# Patient Record
Sex: Female | Born: 1955 | Race: White | Hispanic: No | Marital: Married | Smoking: Never smoker
Health system: Southern US, Community
[De-identification: ages and names within clinical notes are randomized; demographics above are authoritative.]

## PROBLEM LIST (undated history)

## (undated) DIAGNOSIS — E119 Type 2 diabetes mellitus without complications: Secondary | ICD-10-CM

## (undated) DIAGNOSIS — C801 Malignant (primary) neoplasm, unspecified: Secondary | ICD-10-CM

## (undated) DIAGNOSIS — K5792 Diverticulitis of intestine, part unspecified, without perforation or abscess without bleeding: Secondary | ICD-10-CM

## (undated) DIAGNOSIS — E079 Disorder of thyroid, unspecified: Secondary | ICD-10-CM

## (undated) DIAGNOSIS — I10 Essential (primary) hypertension: Secondary | ICD-10-CM

## (undated) HISTORY — PX: ABDOMINAL HYSTERECTOMY: SHX81

---

## 2004-01-21 ENCOUNTER — Ambulatory Visit: Payer: Self-pay | Admitting: Unknown Physician Specialty

## 2004-01-26 ENCOUNTER — Ambulatory Visit: Payer: Self-pay | Admitting: Unknown Physician Specialty

## 2004-01-27 ENCOUNTER — Ambulatory Visit: Payer: Self-pay | Admitting: Unknown Physician Specialty

## 2004-06-14 ENCOUNTER — Ambulatory Visit: Payer: Self-pay | Admitting: Unknown Physician Specialty

## 2006-06-07 ENCOUNTER — Ambulatory Visit: Payer: Self-pay | Admitting: Unknown Physician Specialty

## 2006-07-21 ENCOUNTER — Emergency Department: Payer: Self-pay | Admitting: Emergency Medicine

## 2007-06-06 ENCOUNTER — Ambulatory Visit: Payer: Self-pay | Admitting: Gastroenterology

## 2008-08-26 ENCOUNTER — Ambulatory Visit: Payer: Self-pay | Admitting: Family Medicine

## 2009-09-22 ENCOUNTER — Ambulatory Visit: Payer: Self-pay | Admitting: Unknown Physician Specialty

## 2009-10-06 ENCOUNTER — Ambulatory Visit: Payer: Self-pay | Admitting: Anesthesiology

## 2009-10-11 ENCOUNTER — Ambulatory Visit: Payer: Self-pay | Admitting: Surgery

## 2010-10-26 ENCOUNTER — Ambulatory Visit: Payer: Self-pay | Admitting: Family Medicine

## 2011-10-31 ENCOUNTER — Ambulatory Visit: Payer: Self-pay | Admitting: Family Medicine

## 2011-11-02 ENCOUNTER — Ambulatory Visit: Payer: Self-pay | Admitting: Family Medicine

## 2012-10-31 ENCOUNTER — Ambulatory Visit: Payer: Self-pay | Admitting: Internal Medicine

## 2013-10-15 ENCOUNTER — Observation Stay: Payer: Self-pay | Admitting: Internal Medicine

## 2013-10-15 LAB — COMPREHENSIVE METABOLIC PANEL
ALBUMIN: 3.5 g/dL (ref 3.4–5.0)
ALK PHOS: 52 U/L
AST: 42 U/L — AB (ref 15–37)
Anion Gap: 10 (ref 7–16)
BUN: 14 mg/dL (ref 7–18)
Bilirubin,Total: 0.7 mg/dL (ref 0.2–1.0)
Calcium, Total: 8.7 mg/dL (ref 8.5–10.1)
Chloride: 101 mmol/L (ref 98–107)
Co2: 29 mmol/L (ref 21–32)
Creatinine: 0.82 mg/dL (ref 0.60–1.30)
EGFR (Non-African Amer.): >60
Glucose: 169 mg/dL — ABNORMAL HIGH (ref 65–99)
Osmolality: 284 (ref 275–301)
Potassium: 3.5 mmol/L (ref 3.5–5.1)
SGPT (ALT): 43 U/L
SODIUM: 140 mmol/L (ref 136–145)
TOTAL PROTEIN: 7.2 g/dL (ref 6.4–8.2)

## 2013-10-15 LAB — CBC WITH DIFFERENTIAL/PLATELET
BASOS ABS: 0.1 10*3/uL (ref 0.0–0.1)
Basophil %: 0.8 %
EOS PCT: 4.8 %
Eosinophil #: 0.3 10*3/uL (ref 0.0–0.7)
HCT: 40.8 % (ref 35.0–47.0)
HGB: 13.2 g/dL (ref 12.0–16.0)
Lymphocyte #: 2.4 10*3/uL (ref 1.0–3.6)
Lymphocyte %: 34.5 %
MCH: 29.1 pg (ref 26.0–34.0)
MCHC: 32.4 g/dL (ref 32.0–36.0)
MCV: 90 fL (ref 80–100)
MONOS PCT: 8.6 %
Monocyte #: 0.6 x10 3/mm (ref 0.2–0.9)
NEUTROS ABS: 3.6 10*3/uL (ref 1.4–6.5)
Neutrophil %: 51.3 %
Platelet: 218 10*3/uL (ref 150–440)
RBC: 4.54 10*6/uL (ref 3.80–5.20)
RDW: 13.4 % (ref 11.5–14.5)
WBC: 7 10*3/uL (ref 3.6–11.0)

## 2013-10-25 ENCOUNTER — Observation Stay: Payer: Self-pay | Admitting: Internal Medicine

## 2013-10-25 LAB — BASIC METABOLIC PANEL
ANION GAP: 8 (ref 7–16)
BUN: 13 mg/dL (ref 7–18)
CREATININE: 0.77 mg/dL (ref 0.60–1.30)
Calcium, Total: 8.5 mg/dL (ref 8.5–10.1)
Chloride: 104 mmol/L (ref 98–107)
Co2: 28 mmol/L (ref 21–32)
EGFR (African American): >60
EGFR (Non-African Amer.): >60
GLUCOSE: 115 mg/dL — AB (ref 65–99)
OSMOLALITY: 280 (ref 275–301)
Potassium: 3.4 mmol/L — ABNORMAL LOW (ref 3.5–5.1)
Sodium: 140 mmol/L (ref 136–145)

## 2013-10-25 LAB — CBC WITH DIFFERENTIAL/PLATELET
BASOS ABS: 0.1 10*3/uL (ref 0.0–0.1)
BASOS PCT: 0.7 %
Eosinophil #: 0.2 10*3/uL (ref 0.0–0.7)
Eosinophil %: 2.8 %
HCT: 44.7 % (ref 35.0–47.0)
HGB: 14.6 g/dL (ref 12.0–16.0)
LYMPHS PCT: 26.6 %
Lymphocyte #: 2.3 10*3/uL (ref 1.0–3.6)
MCH: 29.6 pg (ref 26.0–34.0)
MCHC: 32.7 g/dL (ref 32.0–36.0)
MCV: 91 fL (ref 80–100)
MONOS PCT: 7.1 %
Monocyte #: 0.6 x10 3/mm (ref 0.2–0.9)
Neutrophil #: 5.3 10*3/uL (ref 1.4–6.5)
Neutrophil %: 62.8 %
PLATELETS: 244 10*3/uL (ref 150–440)
RBC: 4.94 10*6/uL (ref 3.80–5.20)
RDW: 13.7 % (ref 11.5–14.5)
WBC: 8.5 10*3/uL (ref 3.6–11.0)

## 2013-10-26 LAB — BASIC METABOLIC PANEL
ANION GAP: 8 (ref 7–16)
BUN: 14 mg/dL (ref 7–18)
CO2: 29 mmol/L (ref 21–32)
Calcium, Total: 8.8 mg/dL (ref 8.5–10.1)
Chloride: 101 mmol/L (ref 98–107)
Creatinine: 0.84 mg/dL (ref 0.60–1.30)
EGFR (African American): >60
Glucose: 202 mg/dL — ABNORMAL HIGH (ref 65–99)
Osmolality: 282 (ref 275–301)
Potassium: 3.7 mmol/L (ref 3.5–5.1)
SODIUM: 138 mmol/L (ref 136–145)

## 2013-10-26 LAB — CBC WITH DIFFERENTIAL/PLATELET
BASOS ABS: 0 10*3/uL (ref 0.0–0.1)
Basophil %: 0.2 %
EOS ABS: 0 10*3/uL (ref 0.0–0.7)
EOS PCT: 0 %
HCT: 44.4 % (ref 35.0–47.0)
HGB: 14.8 g/dL (ref 12.0–16.0)
LYMPHS ABS: 1.2 10*3/uL (ref 1.0–3.6)
Lymphocyte %: 10.3 %
MCH: 29.7 pg (ref 26.0–34.0)
MCHC: 33.4 g/dL (ref 32.0–36.0)
MCV: 89 fL (ref 80–100)
MONOS PCT: 1.8 %
Monocyte #: 0.2 x10 3/mm (ref 0.2–0.9)
NEUTROS ABS: 10.5 10*3/uL — AB (ref 1.4–6.5)
Neutrophil %: 87.7 %
Platelet: 269 10*3/uL (ref 150–440)
RBC: 5 10*6/uL (ref 3.80–5.20)
RDW: 13.7 % (ref 11.5–14.5)
WBC: 12 10*3/uL — ABNORMAL HIGH (ref 3.6–11.0)

## 2013-11-20 ENCOUNTER — Emergency Department: Payer: Self-pay | Admitting: Emergency Medicine

## 2013-12-31 ENCOUNTER — Ambulatory Visit: Payer: Self-pay | Admitting: Internal Medicine

## 2014-06-13 NOTE — Consult Note (Signed)
PATIENT NAME:  Jill Holmes, Jill Holmes MR#:  [id number] DATE OF BIRTH:  September 03, 1955  DATE OF CONSULTATION:  10/15/2013  REFERRING PHYSICIAN:   CONSULTING PHYSICIAN:  Sammuel Hines. Richardson Landry, MD  REQUESTING PHYSICIAN: Sona A. Posey Pronto, Concepcion PHYSICIAN: Malon Kindle, MD.   REASON FOR CONSULTATION: Throat swelling.   HISTORY OF PRESENT ILLNESS: This 59 year old female presented to the Emergency Room with complaints of stridor and difficulty swallowing of sudden onset. Yesterday, she had some itching and swelling of her right hand without generalized rash. She later experienced difficulty swallowing a pill when she tried to take some Benadryl and noted that she was having some wheezing and difficulty breathing. She was brought to the Emergency Room by EMS and a CT scan was performed after plain film suggested supraglottic swelling. The scan showed supraglottic edema involving the right vallecula piriform sinus and aryepiglottic folds with mild narrowing of the supraglottic airway. There was also some retropharyngeal swelling without abscess. The patient has not had any fever, and she did not have an elevated white count. She is notably on enalapril, however. She has not had an episode like this previously. She did not have any significant sore throat; although, the throat is slightly sore and feeling dry at this point. She did feel like her lips and tongue might be slightly swollen as well.   PAST MEDICAL HISTORY: Diabetes, hyperlipidemia, hypothyroidism with prior thyroidectomy, hypertension.   ALLERGIES: ENALAPRIL, SULFA.   MEDICATIONS: Metformin 500 mg p.o. b.i.d., lovastatin 20 mg at bedtime, levothyroxine 150 mcg daily, glyburide 5 mg b.i.d., gabapentin 100 mg t.i.d., enalapril/hydrochlorothiazide 10/25 one tablet b.i.d.   FAMILY HISTORY: Positive for hypertension.   SOCIAL HISTORY: She is a nonsmoker, denies drug or alcohol abuse.   REVIEW OF SYSTEMS: No fever, fatigue, weakness, blurred vision,  double vision, ear pain or ear discharge. She has felt mildly short of breath, but no hemoptysis or cough. She denies chest pain, nausea, vomiting, diarrhea, abdominal pain or reflux. She has not had any hematuria. No dysarthria.   PHYSICAL EXAMINATION: VITAL SIGNS: Temperature 98.9, pulse 99, oxygen saturation 94%, blood pressure 137/85.  GENERAL: Well-developed moderately obese female in no acute distress. There is no evidence of stridor or hoarseness.  HEAD AND FACE: Head is normocephalic, atraumatic. No facial skin lesions. Facial strength is normal and symmetric.  EARS: External ears, ear canals, tympanic membranes are clear bilaterally with no middle ear effusion or infection.  NOSE: External nose unremarkable. Nasal cavity is clear with no purulence. She has a small septal spur on the left. There is no evidence of polyps.  ORAL CAVITY AND OROPHARYNX: Lips look minimally edematous. Tongue is a little prominent but not grossly swollen. The floor of mouth is without edema. The teeth are in good condition with no sign of dental caries. Posterior pharynx is clear with no swelling of the uvula or palate.  NECK: Neck is supple without adenopathy or mass. There is no thyromegaly or mass. There is a scar from previous thyroid surgery notable. The salivary glands are soft and nontender without masses.  NEUROLOGIC: Cranial nerves II through XII are grossly intact.   DATA REVIEW: White count was normal at 7. CT scan as discussed above.   PROCEDURE NOTE:  PREOPERATIVE DIAGNOSIS: Supraglottitis.   POSTOPERATIVE DIAGNOSIS: Supraglottitis.   PROCEDURE: Flexible fiber-optic nasal laryngoscopy.   SURGEON: Malon Kindle, MD.   ANESTHESIA: Topical.   DESCRIPTION OF PROCEDURE: After discussing the procedure with the patient, the right nasal cavity was anesthetized with  topical 4% lidocaine. A flexible fiber-optic scope was passed through the right nasal cavity. The scope was passed to evaluate the  hypopharynx, larynx, and tongue base. Vocal cords are mobile and clear. There is some edema of the right aryepiglottic fold in the postcricoid mucosa without erythema or exudate. The edema does extend around involving the piriform sinus, slightly. The epiglottis, however, has minimal edema. Most of the edema is limited to the aryepiglottic fold. There are milder degrees of edema on the left side. The airway is mildly effaced, but patent.   ASSESSMENT: The patient with swelling involving predominantly hypopharynx and aryepiglottic fold region with milder degrees of edema of the tongue and lips. She has already shown a positive response to steroids given in the Emergency Room which has allowed her stridor to subside. She is no longer feeling short of breath.   PLAN: I would observe the patient into the afternoon with a trial of liquids and advancing her diet to make sure she can swallow, and that there is no return of breathing difficulties. As long as she is progressing with her swallowing, she could be discharged on a prednisone taper. This does not appear to be infectious supraglottitis and is most likely angioedema related to her enalapril. Enalapril should be discontinued and an alternate antihypertensive medication provided.     ____________________________ Sammuel Hines. Richardson Landry, MD psb:NT D: 10/15/2013 09:29:32 ET T: 10/15/2013 15:59:19 ET JOB#: [id number]  cc: Sammuel Hines. Richardson Landry, MD, <Dictator> Riley Nearing MD ELECTRONICALLY SIGNED 11/05/2013 8:23

## 2014-06-13 NOTE — H&P (Signed)
PATIENT NAME:  Jill Holmes, Jill Holmes MR#:  [id number] DATE OF BIRTH:  1955-04-12  DATE OF ADMISSION:  10/25/2013  ADMITTING PHYSICIAN: Ralph Leyden, MD   PRIMARY CARE PHYSICIAN: Cletis Athens, MD.   CHIEF COMPLAINT: Facial swelling.   HISTORY OF PRESENT ILLNESS:  Jill Holmes is a 59 year old obese Caucasian female with past medical history significant for hypertension, diabetes, hyperlipidemia who was just admitted to the hospital last week for possible an anaphylactic reaction.  The patient was on enalapril at that time and she actually had severe throat swelling and stridor, because of which ENT had to do a flexible laryngoscopic examination.  She improved with IV steroids, was monitored in ICU, discharged the next and her enalapril was held.  The patient has not taken any enalapril since her discharge.  However, she was started on HCTZ at the time of discharge.  Metoprolol was started by Dr. Lavera Guise who the patient saw a couple of days ago.  She was doing fine up until last night. She went to sit in a recliner and dozed off in the recliner.  Around midnight, she felt that something stung in her left arm because it was itchy.  She had minimal rash there, but this morning when she woke up her face felt numb and tight.  She thought she initially had a mini stroke, but when she looked in the mirror, her lower lip was extremely swollen and all the face extremely swollen.  She did deny any tongue swelling, or posterior pharyngeal wall swelling, or stridor at this time.  She is being admitted for angioedema again, not sure what the triggering factor is at this time.  She could have had a return of her angioedema, as she does not know her family history.  Or, it could be from any of the new medications that she got, metoprolol for 2 days, or it could be from an insect bite that happened last night.  Either way, she needs to be discharged on EpiPen.     PAST MEDICAL HISTORY:  1.  Hypertension.  2.  Diabetes  mellitus.  3.  Hyperlipidemia.  4.  Hypothyroidism after thyroid surgery.  5.  Recent an anaphylactic reaction with angioedema last week.   CURRENT HOME MEDICATIONS:  1.  Gabapentin 100 mg p.o. 3 times a day.  2.  Glyburide 5 mg p.o. b.i.d.  3.  Hydrochlorothiazide 25 mg p.o. daily.  4.  Levothyroxine 150 mcg p.o. daily.  5.  Lovastatin 20 mg p.o. daily.  6.  Metformin 500 mg p.o. b.i.d.  7.  Metoprolol 50 mg p.o. daily.  8.  Prednisone taper which she just finished.   PAST SURGICAL HISTORY:   1.  The patient had subtotal thyroidectomy for thyroid cancer.  2.  Tubal ligation.  3.  Hysterectomy.  4.  Left ankle surgery.   ALLERGIES: SULFA AND ACE INHIBITORS.   SOCIAL HISTORY: Works in [employer], no smoking or alcohol use.   FAMILY HISTORY: Does not know anything about biological father; has breast cancer and diabetes runs in the family.   REVIEW OF SYSTEMS.  CONSTITUTIONAL: No fever, fatigue, or weakness.  EYES: No blurred vision, double vision, inflammation or glaucoma.  EARS, NOSE, THROAT: Positive for sinus drainage, nasal congestion since last week, no epistaxis.  RESPIRATORY: No cough, wheeze, hemoptysis, or COPD.   CARDIOVASCULAR: No chest pain, orthopnea, edema, arrhythmia, palpitations, or syncope.  GASTROINTESTINAL: No nausea, vomiting, diarrhea, abdominal pain, hematemesis, or melena.  GENITOURINARY: No dysuria,  hematuria, renal calculus, frequency or incontinence.  ENDOCRINE: No polyuria, nocturia, thyroid problems, heat or cold intolerance.  HEMATOLOGY: No anemia, easy bruising or bleeding.  SKIN: No acne, rash or lesions.  MUSCULOSKELETAL: No neck, back, shoulder pain, arthritis or gout.  NEUROLOGICAL: No numbness, weakness, CVA, TIA or seizures.  PSYCHOLOGICAL: No anxiety, insomnia or depression.   PHYSICAL EXAMINATION:   VITAL SIGNS: Temperature 98.2 degrees Fahrenheit, pulse 83, respirations 20, blood pressure improved to 154/90, pulse  oximetry 94% on room air.  GENERAL: Heavily-built, well-nourished female sitting in bed, not in any acute distress.  HEENT: Normocephalic, atraumatic. Pupils equal, round, reacting to light. Anicteric sclerae. Extraocular movements intact. She has a humongously enlarged lower lip and also the whole face is stretched out and swollen.  Tongue appears slightly swollen but no swelling noted in the posterior throat.    NECK: Supple. No thyromegaly, JVD or carotid bruits. No thyromegaly.  No lymphadenopathy.  LUNGS: Moving air bilaterally. No wheeze or crackles. No use of accessory muscles for breathing.  CARDIOVASCULAR: S1, S2, regular rate and rhythm. 3/6 systolic murmur heard.  No rubs or gallops.  ABDOMEN: Soft, nontender, nondistended. No hepatosplenomegaly. Normal bowel sounds.  EXTREMITIES: No pedal edema. No clubbing or cyanosis, 2+ dorsalis pedis pulses palpable bilaterally.  SKIN: She had a very minimal erythematous central clearing rash noted on the left inner arm, just above the elbow and there is blotch noted on the breast on both sides. No other rash or lesions.  LYMPHATICS: No cervical lymphadenopathy.  NEUROLOGIC: Cranial nerves intact. No focal motor or sensory deficits.  PSYCHOLOGICAL: The patient is awake, alert, oriented x 3.   LABORATORY DATA: WBC 8.4, hemoglobin 14.6, hematocrit 44.7, platelet count 244.  Sodium 140, potassium 3.4, chloride 104, bicarbonate 28, BUN 13, creatinine 0.7, glucose 115 and calcium of 8.5.   ASSESSMENT AND PLAN: A 59 year old female with hypertension, diabetes, and recent admission for angioedema from enalapril, comes back again with angioedema. 1.  Recurrent angioedema; enalapril has been discontinued last week, now woke up with swelling again.  Either it could be insect bites from last night, her other new medication is metoprolol which was started 2 days ago, which we will hold at this time.  Also cannot rule out her returning angioedema due to her  recurrent episodes, as it just happened the last 2 times, not in the past.   So, we will send a C1 esterase enzyme level in the blood.  The patient refused fresh frozen plasma at this time as she does not feel it as as worse as the last time.  Continue Solu-Medrol, Benadryl p.o. and also Pepcid IV b.i.d.  Monitor in Critical Care Unit at this time.  2.  Hypertension. Continue home hydrochlorothiazide at this time.  3.  Hold metoprolol.  4.  We will start Norvasc if needed.  5.  Diabetes mellitus, insulin sliding scale, glyburide and metformin.   6.  Hyperlipidemia on lovastatin   7.  Deep vein thrombosis prophylaxis.   CODE STATUS: FULL CODE.   TIME SPENT ON ADMISSION: 50 minutes.   ____________________________ Gladstone Lighter, MD rk:DT D: 10/25/2013 14:13:43 ET T: 10/25/2013 14:53:33 ET JOB#: [id number]  cc: Gladstone Lighter, MD, <Dictator> Cletis Athens, MD Gladstone Lighter MD ELECTRONICALLY SIGNED 10/27/2013 17:18

## 2014-06-13 NOTE — Discharge Summary (Signed)
PATIENT NAME:  Jill Holmes, Jill Holmes MR#:  [id number] DATE OF BIRTH:  1956-01-12  DATE OF ADMISSION:  10/25/2013 DATE OF DISCHARGE:  10/26/2013  PRIMARY CARE PHYSICIAN: Cletis Athens, MD   FINAL DIAGNOSES:  1.  Angioedema.  2.  Hypertension.  3.  Diabetes.  4.  Obesity.  5.  Hyperlipidemia.   MEDICATIONS ON DISCHARGE: Lovastatin 20 mg at bedtime, metformin 500 mg twice a day, glyburide 5 mg twice a day, levothyroxine 150 mcg daily, gabapentin 100 mg 3 times a day. Prednisone 10 mg 5 tablets day one, 4 tablets day two, 3 tablets day three, 2 tablets day four, 1 tablet day 5 and 1/2 tablet day six and seven. Amlodipine 5 mg 1 tablet daily, EpiPen 2 pack 3 mg injectable once as needed for throat closing. Stop taking hydrochlorothiazide and metoprolol.   DIET: Low sodium, carbohydrate -controlled diet, regular consistency.   ACTIVITY: As tolerated.   FOLLOWUP: Need to follow up with an allergist as an outpatient; 1-2 weeks with Dr. Rebecka Apley.   HOSPITAL COURSE: The patient was admitted as an observation 10/25/2013, discharged 10/26/2013, came in with lip and facial swelling.   HISTORY OF PRESENT ILLNESS: A 59 year old female who was just recently in with an angioedema anaphylactic reaction. The patient was stopped off her enalapril at that time. She was recently started on hydrochlorothiazide and Toprol was added. She thinks she had some sort of bite, either a bee sting or a spider bite, and then facial swelling and lip swelling, came in for further evaluation, was started on high-dose steroids and Benadryl. A C1 esterase Inhibitor was sent off.   LABORATORY AND RADIOLOGICAL DATA DURING THE HOSPITAL COURSE: Included a glucose of 115, BUN 13, creatinine 0.77, sodium 140, potassium 3.4, chloride 104, CO2 of 28, calcium 8.5. White blood cell count 8.5, H and H 14.6 and 44.7, platelet count of 244,000.   HOSPITAL COURSE PER PROBLEM LIST:  1.  For the patient's angioedema/facial swelling, I think this is  probably still related to the enalapril. Allergic reactions can happen up to 14 days after stopping the medication. The patient is interested in stopping the Toprol and hydrochlorothiazide at this point. I do recommend following up on this C1 esterase inhibitor that was sent off; results of that are still pending. I do recommend following up with an allergist. I did prescribe an EpiPen just in case this happens again. A prednisone taper will be given upon discharge.  2.  Hypertension. She was prescribed Norvasc, which she tolerated here.  3.  Diabetes. Sugars will be high on the steroids, continue metformin and glyburide.  4.  Obesity. Weight loss needed with a BMI of 38.3.  5.  Hyperlipidemia. On lovastatin.  6.  Hypothyroidism. On levothyroxine.  TIME SPENT ON DISCHARGE: Forty minutes.    ____________________________ Tana Conch. Leslye Peer, MD rjw:TT D: 10/26/2013 13:11:32 ET T: 10/26/2013 14:36:12 ET JOB#: [id number]  cc: Tana Conch. Leslye Peer, MD, <Dictator> Cletis Athens, MD Marisue Brooklyn MD ELECTRONICALLY SIGNED 11/04/2013 12:26

## 2014-06-13 NOTE — H&P (Signed)
PATIENT NAME:  Jill Holmes, Jill Holmes MR#:  [id number] DATE OF BIRTH:  Nov 16, 1955  DATE OF ADMISSION:  10/15/2013  PRIMARY CARE PHYSICIAN: Dr. Lavera Guise.  CHIEF COMPLAINT:  Swelling of the throat, difficulty swallowing and shortness of breath tonight.   HISTORY OF PRESENT ILLNESS: Jill Holmes is a 59 year old morbidly obese Caucasian female with past medical history of hypertension, hyperlipidemia and diabetes, who comes to the Emergency Room after she started noticing some swelling of her bilateral fingertips and some itching for the last several days. She took some Benadryl last night before going to bed; however, she felt her Benadryl got stuck in her throat. She was retching and coughing trying to get the Benadryl to pass either way. She then tried to go to bed, however, she woke up in the middle of the night, around 2:00 a.m., gasping for air, having difficulty swallowing, felt thickness of her tongue and came to the Emergency Room. She denies any history of asthma or COPD or use of oxygen at home.  In the Emergency Room she was found to be in mild respiratory distress; however, she was not hypoxic. Her saturations remained stable. X-ray of the neck was done, which showed some retropharyngeal soft tissue fullness and hence CT neck was done, which showed supraglottic edema with mild narrowing of the airway with retropharyngeal effusion and inflammatory changes without abscess. She denied any fever.   Her white count is 7.0. The patient received IV Benadryl, Solu-Medrol and ranitidine in the Emergency Room. She feels a lot better; however, she is going to be admitted for possible angioedema/retropharyngeal edema.   PAST MEDICAL HISTORY:  1.  Hypertension.  2.  Hypercholesterolemia.  3.  Hypothyroidism.  4.  Diabetes.   MEDICATIONS:  1.  Metformin 500 mg b.i.d.  2.  Lovastatin 20 mg daily at bedtime.  3.  Levothyroxine 150 mcg p.o. daily.  4.  Glyburide 5 mg b.i.d.  5.  Gabapentin 100 mg t.i.d.  6.   Enalapril/hydrochlorothiazide 10/25 mg 1 tablet b.i.d.   ALLERGIES: SULFA.   FAMILY HISTORY: Positive for hypertension.   SOCIAL HISTORY: Lives at home. Denies any history of smoking or any drug use or alcohol use.   REVIEW OF SYSTEMS: CONSTITUTIONAL: No fever, fatigue, or weakness.  EYES: No blurred or double vision, glaucoma.  ENT: Positive for difficulty swallowing. Positive for feeling thickness of tongue and dysphagia. No ear discharge ear pain.  RESPIRATORY: Mild shortness of breath. No COPD, hemoptysis, cough.  CARDIOVASCULAR: No chest pain, orthopnea, edema. Positive for hypertension.  GASTROINTESTINAL: No nausea, vomiting, diarrhea, abdominal pain, or GERD.  GENITOURINARY: No dysuria, hematuria, or frequency.  ENDOCRINE: No polyuria, nocturia or thyroid problems.  HEMATOLOGY: No anemia or easy bruising.  SKIN: No acne, rash. The patient does have some swelling of her fingertips. No cellulitis noted.  NEUROLOGIC: No CVA, TIA, dysarthria.  PSYCHIATRIC: No anxiety, depression or bipolar disorder.   All other systems reviewed are negative.   PHYSICAL EXAMINATION:  GENERAL: The patient is awake. She is alert she is oriented x 3, not in acute distress.  VITAL SIGNS: Afebrile, pulse is 99, blood pressure is 143/73. Oxygen saturation 99% on 2 liters.  GENERAL: Morbidly obese, not in acute distress.  HEENT: Atraumatic, normocephalic. Pupils equal, round. EOMI intact. Oral mucosa is moist, tongue coated. No pharyngeal erythema noted.  NECK: Supple. No JVD. No carotid bruit.  LUNGS: Clear to auscultation bilaterally. No rales, rhonchi, respiratory distress or labored breathing.  CARDIOVASCULAR: Both the heart sounds are normal.  Rate is mildly tachycardic. No murmur heard. PMI not lateralized. Chest nontender.  EXTREMITIES: Good pedal pulses, good femoral pulses. No lower extremity edema.  ABDOMEN: Soft, benign, nontender. No organomegaly. Positive bowel sounds.  NEUROLOGIC: Grossly  intact cranial nerves II-XII.  No motor or sensory deficit.  PSYCHIATRIC: The patient is awake, alert, oriented x3.  SKIN: Warm and dry.   LABORATORY DATA:  CBC within normal limits. Comprehensive metabolic panel within normal limits except SGOT of 42 and glucose of 169.   CT of the neck with contrast shows supraglottis mildly narrowing airway, retropharyngeal effusion and inflammatory changes without an abscess.   ASSESSMENT AND PLAN: Jill Holmes is a 59 year old with history of hypertension, diabetes, hypothyroidism, comes in with:  1.  Suspected angioedema secondary to enalapril. The patient came in with significant swelling and difficulty swallowing along with shortness of breath with positive CT findings of supraglottic edema. This likely could be angioedema do to enalapril. She has been on enalapril for several years. The patient denies any fever or tenderness in her neck. There could possibly be a low-grade pharyngeal infection; however, her white count is normal at this time. I will hold off on any antibiotics. We will discuss CT findings with ENT and see if the patient needs to have a direct laryngoscopic evaluation.  2.  Hypertension. Continue hydrochlorothiazide. Add beta blockers or Norvasc if needed. Will list enalapril as an allergic medication given her symptoms recently. 3.  Hyperlipidemia, continue lovastatin.  4.  Hypothyroidism, on Synthroid.  5.  Type 2 diabetes, on metformin and glyburide along with sliding scale insulin.  6.  Deep vein thrombosis prophylaxis with subcutaneous heparin.  7.  The patient was requesting to go home from the Emergency Room, however, given her abnormal CT findings we have asked her to stay at least several hours to get a couple of doses of IV Solu-Medrol and if she still feels better will consider discharging her later on in the evening, or else she will be staying overnight.   TIME SPENT: 50 minutes.   ____________________________ Hart Rochester  Posey Pronto, MD sap:lt D: 10/15/2013 07:54:39 ET T: 10/15/2013 08:43:41 ET JOB#: [id number]  cc: Ivery Michalski A. Posey Pronto, MD, <Dictator> Cletis Athens, MD Ilda Basset MD ELECTRONICALLY SIGNED 10/16/2013 12:32

## 2014-06-13 NOTE — Consult Note (Signed)
Impression: Angioedema, with mild airway narrowing, improving. observation today to make sure patient can eat a soft diet and that there is no return of breathing distress. Start with liquids and advance if tolerated. D/c home on a prednisone taper if doing well, as early as this afternoon if tolerating a PO soft diet. Obviously d/c the enalapril.Reconsult if symptoms worsen.  Electronic Signatures: Riley Nearing (MD)  (Signed on 26-Aug-15 09:32)  Authored  Last Updated: 26-Aug-15 09:32 by Riley Nearing (MD)

## 2014-06-13 NOTE — Discharge Summary (Signed)
PATIENT NAME:  Jill Holmes, Jill Holmes MR#:  [id number] DATE OF BIRTH:  August 25, 1955  DATE OF ADMISSION:  10/15/2013 DATE OF DISCHARGE:  10/16/2013  DIAGNOSES: 1.  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 2.  Hypertension. 3.  Type 2 diabetes mellitus.  DISCHARGE MEDICATIONS: 1.  Neurontin 100 mg p.o. t.i.d. 2.  Prednisone 20 mg 3 times days for 3 days, 2 tablets daily for 2 days, then 1 tablet daily for 2 days. 3.  Lovastatin 20 mg p.o. daily. 5.  Glyburide 5 mg p.o. b.i.d. 6.  Levothyroxine 150 mcg p.o. daily. 7.  Hydrochlorothiazide 25 mg p.o. daily.  metformin 500 mg po bid  The patient advised to stop ACE inhibitors because of severe angioedema.   HOSPITAL COURSE: This patient is a 59 year old female with history of hypertension, type 2 diabetes mellitus, who came in because of swelling of the throat, difficulty swallowing, and trouble breathing.  Look at the history and physical for full details. The patient noted swelling of her fingertips and itching of the fingertips for several days.  The patient took some Benadryl for the itching on August 26.  Then the patient was noted to have swelling of the tongue and also the feeling like she had difficulty swallowing and it felt like her Benadryl got stuck in her throat.  She had an x-ray of the neck which showed some retropharyngeal soft tissue fullness.  She also had a CT scan of the neck which showed supraglottic edema with mild narrowing of the airway.  She did not have any abscess.  She did not have any fever.  Her labs remained stable.  She was admitted to the hospitalist service for angioedema likely secondary to ACE inhibitors.  The patient received IV Decadron along with Benadryl.  The patient was seen by ENT. Dr. Richardson Landry saw the patient, and he suggested the patient have a fiberoptic laryngoscopic evaluation which showed the patient's vocal cords were clear and had some aryepiglottic edema and posterior mucosa was without  erythema.  The patient's airway was mildly effaced but patent, so he suggested the patient be observed for 24 hours with IV steroids and monitor her breathing.  He started her on a liquid diet.  The next 2 days, the patient's swelling improved nicely, and her breathing was normal, and the patient was able to eat regular food.  The patient was thought to have angioedema secondary to enalapril and enalapril was discontinued.  > the patient said that she has taken it for a long time but we explained that it can happen any time during the course of treatment and advised to stay off of the enalapril.  She was continued on hydrochlorothiazide and she has an appointment with Dr. Lavera Guise on August 29 and she will follow up with him regarding blood pressure management.    DISCHARGE VITAL SIGNS: Temperature 97.6, heart rate 78, blood pressure 146/84, and saturations were 96% on room air.   The patient's other lab data were unremarkable, especially the CBC and comprehensive metabolic panel.    CT neck showed supraglottic mild narrowing airways with retropharyngeal effusion and inflammatory changes without abscess.    TIME SPENT:  More than 30 minutes.     ____________________________ Epifanio Lesches, MD sk:nr D: 10/18/2013 13:56:00 ET T: 10/18/2013 19:53:09 ET JOB#: [id number]  cc: Cletis Athens, MD Epifanio Lesches, MD, <Dictator>   Epifanio Lesches MD ELECTRONICALLY SIGNED 10/29/2013 16:37

## 2014-09-08 ENCOUNTER — Telehealth: Payer: Self-pay | Admitting: Gastroenterology

## 2014-09-08 NOTE — Telephone Encounter (Signed)
Please contact for colonoscopy screening. Referral from Dr Cletis Athens. Referral in Media

## 2014-09-08 NOTE — Telephone Encounter (Signed)
LVM for pt to return my call.

## 2014-09-14 NOTE — Telephone Encounter (Signed)
Mailed letter °

## 2014-09-28 ENCOUNTER — Emergency Department: Payer: BC Managed Care – PPO

## 2014-09-28 ENCOUNTER — Emergency Department
Admission: EM | Admit: 2014-09-28 | Discharge: 2014-09-28 | Disposition: A | Discharge status: Home / Self Care | Payer: BC Managed Care – PPO | Attending: Emergency Medicine | Admitting: Emergency Medicine

## 2014-09-28 ENCOUNTER — Encounter: Payer: Self-pay | Admitting: Emergency Medicine

## 2014-09-28 DIAGNOSIS — R109 Unspecified abdominal pain: Secondary | ICD-10-CM | POA: Diagnosis present

## 2014-09-28 DIAGNOSIS — E119 Type 2 diabetes mellitus without complications: Secondary | ICD-10-CM | POA: Diagnosis not present

## 2014-09-28 DIAGNOSIS — R1011 Right upper quadrant pain: Secondary | ICD-10-CM | POA: Diagnosis not present

## 2014-09-28 DIAGNOSIS — R112 Nausea with vomiting, unspecified: Secondary | ICD-10-CM

## 2014-09-28 DIAGNOSIS — I10 Essential (primary) hypertension: Secondary | ICD-10-CM | POA: Insufficient documentation

## 2014-09-28 HISTORY — DX: Disorder of thyroid, unspecified: E07.9

## 2014-09-28 HISTORY — DX: Essential (primary) hypertension: I10

## 2014-09-28 HISTORY — DX: Type 2 diabetes mellitus without complications: E11.9

## 2014-09-28 HISTORY — DX: Diverticulitis of intestine, part unspecified, without perforation or abscess without bleeding: K57.92

## 2014-09-28 LAB — URINALYSIS COMPLETE WITH MICROSCOPIC (ARMC ONLY)
BILIRUBIN URINE: NEGATIVE
Glucose, UA: NEGATIVE mg/dL
Hgb urine dipstick: NEGATIVE
KETONES UR: NEGATIVE mg/dL
LEUKOCYTES UA: NEGATIVE
NITRITE: NEGATIVE
Protein, ur: NEGATIVE mg/dL
RBC / HPF: NONE SEEN RBC/hpf (ref 0–5)
SPECIFIC GRAVITY, URINE: 1.002 — AB (ref 1.005–1.030)
pH: 8 (ref 5.0–8.0)

## 2014-09-28 LAB — CBC
HCT: 41.7 % (ref 35.0–47.0)
Hemoglobin: 14.2 g/dL (ref 12.0–16.0)
MCH: 29.3 pg (ref 26.0–34.0)
MCHC: 34 g/dL (ref 32.0–36.0)
MCV: 86.1 fL (ref 80.0–100.0)
Platelets: 232 10*3/uL (ref 150–440)
RBC: 4.85 MIL/uL (ref 3.80–5.20)
RDW: 14.2 % (ref 11.5–14.5)
WBC: 6.1 10*3/uL (ref 3.6–11.0)

## 2014-09-28 LAB — COMPREHENSIVE METABOLIC PANEL WITH GFR
ALT: 29 U/L (ref 14–54)
AST: 31 U/L (ref 15–41)
Albumin: 4.5 g/dL (ref 3.5–5.0)
Alkaline Phosphatase: 54 U/L (ref 38–126)
Anion gap: 8 (ref 5–15)
BUN: 8 mg/dL (ref 6–20)
CO2: 28 mmol/L (ref 22–32)
Calcium: 9.1 mg/dL (ref 8.9–10.3)
Chloride: 100 mmol/L — ABNORMAL LOW (ref 101–111)
Creatinine, Ser: 0.61 mg/dL (ref 0.44–1.00)
GFR calc Af Amer: >60 mL/min
GFR calc non Af Amer: >60 mL/min
Glucose, Bld: 138 mg/dL — ABNORMAL HIGH (ref 65–99)
Potassium: 3.5 mmol/L (ref 3.5–5.1)
Sodium: 136 mmol/L (ref 135–145)
Total Bilirubin: 0.7 mg/dL (ref 0.3–1.2)
Total Protein: 8 g/dL (ref 6.5–8.1)

## 2014-09-28 LAB — LIPASE, BLOOD: LIPASE: 25 U/L (ref 22–51)

## 2014-09-28 MED ORDER — GI COCKTAIL ~~LOC~~
30.0000 mL | Freq: Once | ORAL | Status: AC
  Administered 2014-09-28: 30 mL via ORAL
  Filled 2014-09-28: qty 30

## 2014-09-28 MED ORDER — ONDANSETRON HCL 4 MG PO TABS: 4.0000 mg | ORAL_TABLET | Freq: Every day | ORAL | Status: AC | PRN

## 2014-09-28 MED ORDER — RANITIDINE HCL 150 MG PO TABS: 150.0000 mg | ORAL_TABLET | Freq: Every day | ORAL | Status: DC

## 2014-09-28 MED ORDER — OXYCODONE-ACETAMINOPHEN 5-325 MG PO TABS
2.0000 | ORAL_TABLET | Freq: Once | ORAL | Status: AC
  Administered 2014-09-28: 2 via ORAL
  Filled 2014-09-28: qty 2

## 2014-09-28 MED ORDER — OXYCODONE-ACETAMINOPHEN 5-325 MG PO TABS: 1.0000 | ORAL_TABLET | Freq: Four times a day (QID) | ORAL | Status: DC | PRN

## 2014-09-28 NOTE — ED Provider Notes (Signed)
[facility] Emergency Department Provider Note     Time seen: ----------------------------------------- 2:08 PM on 09/28/2014 -----------------------------------------    I have reviewed the triage vital signs and the nursing notes.   HISTORY  Chief Complaint Abdominal Pain    HPI Jill Holmes is a 59 y.o. female who presents ER with right-sided abdominal pain. Patient reports his history of diverticulitis and has recently seen her doctor for same. She is placed on antibiotics, reports episode of nausea and vomiting yesterday.Right-sided abdominal pain is described as burning, severe at this point.   Past Medical History  Diagnosis Date  . Diverticulitis   . Hypertension   . Thyroid disease   . Diabetes mellitus without complication     There are no active problems to display for this patient.   History reviewed. No pertinent past surgical history.  Allergies Lisinopril and Sulfa antibiotics  Social History History  Substance Use Topics  . Smoking status: Never Smoker   . Smokeless tobacco: Never Used  . Alcohol Use: No    Review of Systems Constitutional: Negative for fever. Eyes: Negative for visual changes. ENT: Negative for sore throat. Cardiovascular: Negative for chest pain. Respiratory: Negative for shortness of breath. Gastrointestinal: Positive for abdominal pain, vomiting Genitourinary: Negative for dysuria. Musculoskeletal: Negative for back pain. Skin: Negative for rash. Neurological: Negative for headaches, focal weakness or numbness.  10-point ROS otherwise negative.  ____________________________________________   PHYSICAL EXAM:  VITAL SIGNS: ED Triage Vitals  Enc Vitals Group     BP 09/28/14 1144 176/97 mmHg     Pulse Rate 09/28/14 1144 72     Resp 09/28/14 1144 20     Temp 09/28/14 1144 98 F (36.7 C)     Temp Source 09/28/14 1144 Oral     SpO2 09/28/14 1144 98 %     Weight 09/28/14 1144 230 lb  (104.327 kg)     Height 09/28/14 1144 5\' 6"  (1.676 m)     Head Cir --      Peak Flow --      Pain Score 09/28/14 1145 10     Pain Loc --      Pain Edu? --      Excl. in [place]? --     Constitutional: Alert and oriented. Well appearing and in no distress. Eyes: Conjunctivae are normal. PERRL. Normal extraocular movements. ENT   Head: Normocephalic and atraumatic.   Nose: No congestion/rhinnorhea.   Mouth/Throat: Mucous membranes are moist.   Neck: No stridor. Cardiovascular: Normal rate, regular rhythm. Normal and symmetric distal pulses are present in all extremities. No murmurs, rubs, or gallops. Respiratory: Normal respiratory effort without tachypnea nor retractions. Breath sounds are clear and equal bilaterally. No wheezes/rales/rhonchi. Gastrointestinal: Generalized abdominal tenderness, worse in the right upper quadrant. Hypoactive bowel sounds. Musculoskeletal: Nontender with normal range of motion in all extremities. No joint effusions.  No lower extremity tenderness nor edema. Neurologic:  Normal speech and language. No gross focal neurologic deficits are appreciated. Speech is normal. No gait instability. Skin:  Skin is warm, dry and intact. No rash noted. Psychiatric: Mood and affect are normal. Speech and behavior are normal. Patient exhibits appropriate insight and judgment.  ____________________________________________  ED COURSE:  Pertinent labs & imaging results that were available during my care of the patient were reviewed by me and considered in my medical decision making (see chart for details). A she will need abdominal labs, ultrasound and urinalysis. ____________________________________________    LABS (pertinent positives/negatives)  Labs Reviewed  COMPREHENSIVE METABOLIC PANEL - Abnormal; Notable for the following:    Chloride 100 (*)    Glucose, Bld 138 (*)    All other components within normal limits  URINALYSIS COMPLETEWITH MICROSCOPIC  ([facility] ONLY) - Abnormal; Notable for the following:    Color, Urine COLORLESS (*)    APPearance CLEAR (*)    Specific Gravity, Urine 1.002 (*)    Bacteria, UA RARE (*)    Squamous Epithelial / LPF 0-5 (*)    All other components within normal limits  LIPASE, BLOOD  CBC    RADIOLOGY Images were viewed by me  Right upper quadrant ultrasound IMPRESSION:  1. Hepatic steatosis.  2. Otherwise, unremarkable right upper quadrant ultrasound.  Specifically, negative for cholelithiasis or evidence of  cholecystitis.   ____________________________________________  FINAL ASSESSMENT AND PLAN  Abdominal pain and vomiting  Plan: Patient with labs and imaging as dictated above. Patient with likely chronic pain or chronic vomiting. Her exam is unremarkable this time. She does not have any leukocytosis, is stable for discharge on antiemetics and continuing an acids.   Earleen Newport, MD   Earleen Newport, MD 09/28/14 [phone]

## 2014-09-28 NOTE — Discharge Instructions (Signed)
Abdominal Pain °Many things can cause abdominal pain. Usually, abdominal pain is not caused by a disease and will improve without treatment. It can often be observed and treated at home. Your health care provider will do a physical exam and possibly order blood tests and X-rays to help determine the seriousness of your pain. However, in many cases, more time must pass before a clear cause of the pain can be found. Before that point, your health care provider may not know if you need more testing or further treatment. °HOME CARE INSTRUCTIONS  °Monitor your abdominal pain for any changes. The following actions may help to alleviate any discomfort you are experiencing: °· Only take over-the-counter or prescription medicines as directed by your health care provider. °· Do not take laxatives unless directed to do so by your health care provider. °· Try a clear liquid diet (broth, tea, or water) as directed by your health care provider. Slowly move to a bland diet as tolerated. °SEEK MEDICAL CARE IF: °· You have unexplained abdominal pain. °· You have abdominal pain associated with nausea or diarrhea. °· You have pain when you urinate or have a bowel movement. °· You experience abdominal pain that wakes you in the night. °· You have abdominal pain that is worsened or improved by eating food. °· You have abdominal pain that is worsened with eating fatty foods. °· You have a fever. °SEEK IMMEDIATE MEDICAL CARE IF:  °· Your pain does not go away within 2 hours. °· You keep throwing up (vomiting). °· Your pain is felt only in portions of the abdomen, such as the right side or the left lower portion of the abdomen. °· You pass bloody or black tarry stools. °MAKE SURE YOU: °· Understand these instructions.   °· Will watch your condition.   °· Will get help right away if you are not doing well or get worse.   °Document Released: 11/16/2004 Document Revised: 02/11/2013 Document Reviewed: 10/16/2012 °[employer]® Patient Information  ©2015 [employer]. This information is not intended to replace advice given to you by your health care provider. Make sure you discuss any questions you have with your health care provider. ° °Nausea and Vomiting °Nausea means you feel sick to your stomach. Throwing up (vomiting) is a reflex where stomach contents come out of your mouth. °HOME CARE  °· Take medicine as told by your doctor. °· Do not force yourself to eat. However, you do need to drink fluids. °· If you feel like eating, eat a normal diet as told by your doctor. °¨ Eat rice, wheat, potatoes, bread, lean meats, yogurt, fruits, and vegetables. °¨ Avoid high-fat foods. °· Drink enough fluids to keep your pee (urine) clear or pale yellow. °· Ask your doctor how to replace body fluid losses (rehydrate). Signs of body fluid loss (dehydration) include: °¨ Feeling very thirsty. °¨ Dry lips and mouth. °¨ Feeling dizzy. °¨ Dark pee. °¨ Peeing less than normal. °¨ Feeling confused. °¨ Fast breathing or heart rate. °GET HELP RIGHT AWAY IF:  °· You have blood in your throw up. °· You have black or bloody poop (stool). °· You have a bad headache or stiff neck. °· You feel confused. °· You have bad belly (abdominal) pain. °· You have chest pain or trouble breathing. °· You do not pee at least once every 8 hours. °· You have cold, clammy skin. °· You keep throwing up after 24 to 48 hours. °· You have a fever. °MAKE SURE YOU:  °· Understand   these instructions. °· Will watch your condition. °· Will get help right away if you are not doing well or get worse. °Document Released: 07/26/2007 Document Revised: 05/01/2011 Document Reviewed: 07/08/2010 °[employer]® Patient Information ©2015 [employer]. This information is not intended to replace advice given to you by your health care provider. Make sure you discuss any questions you have with your health care provider. ° °

## 2014-09-28 NOTE — ED Notes (Signed)
Patient to ED with c/o right side abdominal pain, patient has history of diverticulitis and was treated for same a couple of weeks ago. Patient reports episode of nausea and vomiting yesterday.

## 2014-10-26 ENCOUNTER — Emergency Department: Payer: BC Managed Care – PPO

## 2014-10-26 ENCOUNTER — Emergency Department
Admission: EM | Admit: 2014-10-26 | Discharge: 2014-10-26 | Disposition: A | Discharge status: Home / Self Care | Payer: BC Managed Care – PPO | Attending: Emergency Medicine | Admitting: Emergency Medicine

## 2014-10-26 ENCOUNTER — Encounter: Payer: Self-pay | Admitting: *Deleted

## 2014-10-26 DIAGNOSIS — R109 Unspecified abdominal pain: Secondary | ICD-10-CM | POA: Insufficient documentation

## 2014-10-26 DIAGNOSIS — E119 Type 2 diabetes mellitus without complications: Secondary | ICD-10-CM | POA: Insufficient documentation

## 2014-10-26 DIAGNOSIS — F430 Acute stress reaction: Secondary | ICD-10-CM | POA: Insufficient documentation

## 2014-10-26 DIAGNOSIS — I10 Essential (primary) hypertension: Secondary | ICD-10-CM | POA: Diagnosis not present

## 2014-10-26 DIAGNOSIS — F439 Reaction to severe stress, unspecified: Secondary | ICD-10-CM

## 2014-10-26 DIAGNOSIS — R079 Chest pain, unspecified: Secondary | ICD-10-CM | POA: Insufficient documentation

## 2014-10-26 DIAGNOSIS — Z79899 Other long term (current) drug therapy: Secondary | ICD-10-CM | POA: Insufficient documentation

## 2014-10-26 DIAGNOSIS — R002 Palpitations: Secondary | ICD-10-CM | POA: Diagnosis present

## 2014-10-26 LAB — URINALYSIS COMPLETE WITH MICROSCOPIC (ARMC ONLY)
BILIRUBIN URINE: NEGATIVE
Glucose, UA: NEGATIVE mg/dL
Hgb urine dipstick: NEGATIVE
Ketones, ur: NEGATIVE mg/dL
Leukocytes, UA: NEGATIVE
Nitrite: NEGATIVE
PH: 7 (ref 5.0–8.0)
Protein, ur: NEGATIVE mg/dL
RBC / HPF: NONE SEEN RBC/hpf (ref 0–5)
Specific Gravity, Urine: 1.006 (ref 1.005–1.030)

## 2014-10-26 LAB — CBC
HCT: 39.8 % (ref 35.0–47.0)
Hemoglobin: 13.5 g/dL (ref 12.0–16.0)
MCH: 29.7 pg (ref 26.0–34.0)
MCHC: 33.8 g/dL (ref 32.0–36.0)
MCV: 87.9 fL (ref 80.0–100.0)
Platelets: 224 10*3/uL (ref 150–440)
RBC: 4.53 MIL/uL (ref 3.80–5.20)
RDW: 14.2 % (ref 11.5–14.5)
WBC: 6.1 10*3/uL (ref 3.6–11.0)

## 2014-10-26 LAB — COMPREHENSIVE METABOLIC PANEL
ALT: 28 U/L (ref 14–54)
AST: 41 U/L (ref 15–41)
Albumin: 4 g/dL (ref 3.5–5.0)
Alkaline Phosphatase: 48 U/L (ref 38–126)
Anion gap: 9 (ref 5–15)
BUN: 12 mg/dL (ref 6–20)
CHLORIDE: 103 mmol/L (ref 101–111)
CO2: 28 mmol/L (ref 22–32)
Calcium: 9.4 mg/dL (ref 8.9–10.3)
Creatinine, Ser: 0.7 mg/dL (ref 0.44–1.00)
GFR calc non Af Amer: >60 mL/min (ref >60)
Glucose, Bld: 150 mg/dL — ABNORMAL HIGH (ref 65–99)
Potassium: 3.4 mmol/L — ABNORMAL LOW (ref 3.5–5.1)
SODIUM: 140 mmol/L (ref 135–145)
Total Bilirubin: 0.8 mg/dL (ref 0.3–1.2)
Total Protein: 6.9 g/dL (ref 6.5–8.1)

## 2014-10-26 LAB — LIPASE, BLOOD: LIPASE: 34 U/L (ref 22–51)

## 2014-10-26 LAB — TROPONIN I

## 2014-10-26 NOTE — ED Notes (Signed)
Pt ambulatory to xray.

## 2014-10-26 NOTE — ED Notes (Signed)
Report given to Derrick RN.

## 2014-10-26 NOTE — ED Notes (Addendum)
Pt c/o R sided chest pain, palpitations that woke her from sleep. Pt denies dyspnea. Pt c/o slight nausea. Pt denies cardiac hx. Pt also c/o RUQ abdominal pain which is recurring and started after arriving to ED.

## 2014-10-26 NOTE — ED Notes (Signed)
MD at bedside for eval.

## 2014-10-26 NOTE — ED Provider Notes (Signed)
[facility] Emergency Department Provider Note  ____________________________________________  Time seen: Approximately 6:20 AM  I have reviewed the triage vital signs and the nursing notes.   HISTORY  Chief Complaint Palpitations; Panic Attack; and Abdominal Pain    HPI Jill Holmes is a 59 y.o. female presents to the ED from home for a chief complain of chest pain, palpitations and anxiety.Patient awoke from sleep approximately midnight with central chest discomfort associated with pounding, regular heart rate. Denies diaphoresis, shortness of breath, nausea, vomiting. 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 Denies recent fever, chills, cough, congestion, calf swelling or pain. Denies recent travel or surgery. Currently taking antibiotic for recent dental infection. Patient recently being worked up for ongoing upper abdominal pain. Patient has no prior history of CAD or MI.   Past Medical History  Diagnosis Date  . Diverticulitis   . Hypertension   . Thyroid disease   . Diabetes mellitus without complication     There are no active problems to display for this patient.   History reviewed. No pertinent past surgical history.  Current Outpatient Rx  Name  Route  Sig  Dispense  Refill  . ondansetron (ZOFRAN) 4 MG tablet   Oral   Take 1 tablet (4 mg total) by mouth daily as needed for nausea or vomiting.   20 tablet   1   . oxyCODONE-acetaminophen (ROXICET) 5-325 MG per tablet   Oral   Take 1 tablet by mouth every 6 (six) hours as needed.   20 tablet   0   . ranitidine (ZANTAC) 150 MG tablet   Oral   Take 1 tablet (150 mg total) by mouth at bedtime.   30 tablet   1     Allergies Lisinopril and Sulfa antibiotics  Family history Mother with "heart problems"   Social  History Social History  Substance Use Topics  . Smoking status: Never Smoker   . Smokeless tobacco: Never Used  . Alcohol Use: No    Review of Systems Constitutional: No fever/chills Eyes: No visual changes. ENT: No sore throat. Cardiovascular: Positive for chest pain. Respiratory: Denies shortness of breath. Gastrointestinal: No abdominal pain.  No nausea, no vomiting.  No diarrhea.  No constipation. Genitourinary: Negative for dysuria. Musculoskeletal: Negative for back pain. Skin: Negative for rash. Neurological: Negative for headaches, focal weakness or numbness.  10-point ROS otherwise negative.  ____________________________________________   PHYSICAL EXAM:  VITAL SIGNS: ED Triage Vitals  Enc Vitals Group     BP 10/26/14 0011 189/102 mmHg     Pulse Rate 10/26/14 0011 83     Resp 10/26/14 0011 20     Temp 10/26/14 0011 97.7 F (36.5 C)     Temp Source 10/26/14 0011 Oral     SpO2 10/26/14 0011 99 %     Weight 10/26/14 0011 230 lb (104.327 kg)     Height 10/26/14 0011 5\' 6"  (1.676 m)     Head Cir --      Peak Flow --      Pain Score 10/26/14 0012 8     Pain Loc --      Pain Edu? --      Excl. in [place]? --     Constitutional: Alert and oriented. Well appearing and in no acute distress. Eyes: Conjunctivae are normal. PERRL.  EOMI. Head: Atraumatic. Nose: No congestion/rhinnorhea. Mouth/Throat: Mucous membranes are moist.  Oropharynx non-erythematous. Neck: No stridor. No carotid bruits Cardiovascular: Normal rate, regular rhythm. Grossly normal heart sounds.  Good peripheral circulation. Respiratory: Normal respiratory effort.  No retractions. Lungs CTAB. Gastrointestinal: Soft and nontender. No distention. No abdominal bruits. No CVA tenderness. Musculoskeletal: No lower extremity tenderness nor edema.  No joint effusions. Neurologic:  Normal speech and language. No gross focal neurologic deficits are appreciated. No gait instability. Skin:  Skin is warm, dry  and intact. No rash noted. Psychiatric: Mood and affect are normal. Speech and behavior are normal.  ____________________________________________   LABS (all labs ordered are listed, but only abnormal results are displayed)  Labs Reviewed  COMPREHENSIVE METABOLIC PANEL - Abnormal; Notable for the following:    Potassium 3.4 (*)    Glucose, Bld 150 (*)    All other components within normal limits  URINALYSIS COMPLETEWITH MICROSCOPIC ([facility] ONLY) - Abnormal; Notable for the following:    Color, Urine STRAW (*)    APPearance CLEAR (*)    Bacteria, UA RARE (*)    Squamous Epithelial / LPF 0-5 (*)    All other components within normal limits  LIPASE, BLOOD  CBC  TROPONIN I  TROPONIN I   ____________________________________________  EKG  ED ECG REPORT I, Daymien Goth J, the attending physician, personally viewed and interpreted this ECG.   Date: 10/26/2014  EKG Time: 0019  Rate: 75  Rhythm: normal EKG, normal sinus rhythm  Axis: LAD  Intervals:left anterior fascicular block  ST&T Change: Nonspecific  ____________________________________________  RADIOLOGY  Chest 2 view (view by me, interpreted per Dr. Joneen Caraway): No acute abnormality. ____________________________________________   PROCEDURES  Procedure(s) performed: None  Critical Care performed: No  ____________________________________________   INITIAL IMPRESSION / ASSESSMENT AND PLAN / ED COURSE  Pertinent labs & imaging results that were available during my care of the patient were reviewed by me and considered in my medical decision making (see chart for details).  59 year old female who presents with chest pain, palpitations and increased stress level. Patient is low risk for acute coronary syndrome. Low suspicion for pulmonary embolism. Plan to repeat troponin level, obtain chest x-ray and reassess. Patient currently denies chest pain or palpitations.  ----------------------------------------- 7:35 AM on  10/26/2014 -----------------------------------------  Updated patient of repeat negative troponin. Patient's PCP is Dr. Lavera Holmes and she will follow-up with him next day for evaluation and possible outpatient stress test. Strict return precautions given. Patient verbalizes understanding and agrees with plan of care. ____________________________________________   FINAL CLINICAL IMPRESSION(S) / ED DIAGNOSES  Final diagnoses:  Chest pain, unspecified chest pain type  Stress      Paulette Blanch, MD 10/26/14 [phone]

## 2014-10-26 NOTE — Discharge Instructions (Signed)
1. Please call the number provided to schedule an appointment with the cardiologist. You will likely benefit from a stress test. 2. Return to the ER for worsening symptoms, persistent vomiting, difficulty breathing or other concerns.  Chest Pain (Nonspecific) It is often hard to give a specific diagnosis for the cause of chest pain. There is always a chance that your pain could be related to something serious, such as a heart attack or a blood clot in the lungs. You need to follow up with your health care provider for further evaluation. CAUSES   Heartburn.  Pneumonia or bronchitis.  Anxiety or stress.  Inflammation around your heart (pericarditis) or lung (pleuritis or pleurisy).  A blood clot in the lung.  A collapsed lung (pneumothorax). It can develop suddenly on its own (spontaneous pneumothorax) or from trauma to the chest.  Shingles infection (herpes zoster virus). The chest wall is composed of bones, muscles, and cartilage. Any of these can be the source of the pain.  The bones can be bruised by injury.  The muscles or cartilage can be strained by coughing or overwork.  The cartilage can be affected by inflammation and become sore (costochondritis). DIAGNOSIS  Lab tests or other studies may be needed to find the cause of your pain. Your health care provider may have you take a test called an ambulatory electrocardiogram (ECG). An ECG records your heartbeat patterns over a 24-hour period. You may also have other tests, such as:  Transthoracic echocardiogram (TTE). During echocardiography, sound waves are used to evaluate how blood flows through your heart.  Transesophageal echocardiogram (TEE).  Cardiac monitoring. This allows your health care provider to monitor your heart rate and rhythm in real time.  Holter monitor. This is a portable device that records your heartbeat and can help diagnose heart arrhythmias. It allows your health care provider to track your heart  activity for several days, if needed.  Stress tests by exercise or by giving medicine that makes the heart beat faster. TREATMENT   Treatment depends on what may be causing your chest pain. Treatment may include:  Acid blockers for heartburn.  Anti-inflammatory medicine.  Pain medicine for inflammatory conditions.  Antibiotics if an infection is present.  You may be advised to change lifestyle habits. This includes stopping smoking and avoiding alcohol, caffeine, and chocolate.  You may be advised to keep your head raised (elevated) when sleeping. This reduces the chance of acid going backward from your stomach into your esophagus. Most of the time, nonspecific chest pain will improve within 2-3 days with rest and mild pain medicine.  HOME CARE INSTRUCTIONS   If antibiotics were prescribed, take them as directed. Finish them even if you start to feel better.  For the next few days, avoid physical activities that bring on chest pain. Continue physical activities as directed.  Do not use any tobacco products, including cigarettes, chewing tobacco, or electronic cigarettes.  Avoid drinking alcohol.  Only take medicine as directed by your health care provider.  Follow your health care provider's suggestions for further testing if your chest pain does not go away.  Keep any follow-up appointments you made. If you do not go to an appointment, you could develop lasting (chronic) problems with pain. If there is any problem keeping an appointment, call to reschedule. SEEK MEDICAL CARE IF:   Your chest pain does not go away, even after treatment.  You have a rash with blisters on your chest.  You have a fever. SEEK IMMEDIATE  MEDICAL CARE IF:   You have increased chest pain or pain that spreads to your arm, neck, jaw, back, or abdomen.  You have shortness of breath.  You have an increasing cough, or you cough up blood.  You have severe back or abdominal pain.  You feel  nauseous or vomit.  You have severe weakness.  You faint.  You have chills. This is an emergency. Do not wait to see if the pain will go away. Get medical help at once. Call your local emergency services (911 in [place] Do not drive yourself to the hospital. MAKE SURE YOU:   Understand these instructions.  Will watch your condition.  Will get help right away if you are not doing well or get worse. Document Released: 11/16/2004 Document Revised: 02/11/2013 Document Reviewed: 09/12/2007 [facility] Patient Information 2015 [place], [place]. This information is not intended to replace advice given to you by your health care provider. Make sure you discuss any questions you have with your health care provider.  Stress and Stress Management Stress is a normal reaction to life events. It is what you feel when life demands more than you are used to or more than you can handle. Some stress can be useful. For example, the stress reaction can help you catch the last bus of the day, study for a test, or meet a deadline at work. But stress that occurs too often or for too long can cause problems. It can affect your emotional health and interfere with relationships and normal daily activities. Too much stress can weaken your immune system and increase your risk for physical illness. If you already have a medical problem, stress can make it worse. CAUSES  All sorts of life events may cause stress. An event that causes stress for one person may not be stressful for another person. Major life events commonly cause stress. These may be positive or negative. Examples include losing your job, moving into a new home, getting married, having a baby, or losing a loved one. Less obvious life events may also cause stress, especially if they occur day after day or in combination. Examples include working long hours, driving in traffic, caring for children, being in debt, or being in a difficult relationship. SIGNS AND  SYMPTOMS Stress may cause emotional symptoms including, the following:  Anxiety. This is feeling worried, afraid, on edge, overwhelmed, or out of control.  Anger. This is feeling irritated or impatient.  Depression. This is feeling sad, down, helpless, or guilty.  Difficulty focusing, remembering, or making decisions. Stress may cause physical symptoms, including the following:   Aches and pains. These may affect your head, neck, back, stomach, or other areas of your body.  Tight muscles or clenched jaw.  Low energy or trouble sleeping. Stress may cause unhealthy behaviors, including the following:   Eating to feel better (overeating) or skipping meals.  Sleeping too little, too much, or both.  Working too much or putting off tasks (procrastination).  Smoking, drinking alcohol, or using drugs to feel better. DIAGNOSIS  Stress is diagnosed through an assessment by your health care provider. Your health care provider will ask questions about your symptoms and any stressful life events.Your health care provider will also ask about your medical history and may order blood tests or other tests. Certain medical conditions and medicine can cause physical symptoms similar to stress. Mental illness can cause emotional symptoms and unhealthy behaviors similar to stress. Your health care provider may refer you to a mental  health professional for further evaluation.  TREATMENT  Stress management is the recommended treatment for stress.The goals of stress management are reducing stressful life events and coping with stress in healthy ways.  Techniques for reducing stressful life events include the following:  Stress identification. Self-monitor for stress and identify what causes stress for you. These skills may help you to avoid some stressful events.  Time management. Set your priorities, keep a calendar of events, and learn to say "no." These tools can help you avoid making too many  commitments. Techniques for coping with stress include the following:  Rethinking the problem. Try to think realistically about stressful events rather than ignoring them or overreacting. Try to find the positives in a stressful situation rather than focusing on the negatives.  Exercise. Physical exercise can release both physical and emotional tension. The key is to find a form of exercise you enjoy and do it regularly.  Relaxation techniques. These relax the body and mind. Examples include yoga, meditation, tai chi, biofeedback, deep breathing, progressive muscle relaxation, listening to music, being out in nature, journaling, and other hobbies. Again, the key is to find one or more that you enjoy and can do regularly.  Healthy lifestyle. Eat a balanced diet, get plenty of sleep, and do not smoke. Avoid using alcohol or drugs to relax.  Strong support network. Spend time with family, friends, or other people you enjoy being around.Express your feelings and talk things over with someone you trust. Counseling or talktherapy with a mental health professional may be helpful if you are having difficulty managing stress on your own. Medicine is typically not recommended for the treatment of stress.Talk to your health care provider if you think you need medicine for symptoms of stress. HOME CARE INSTRUCTIONS  Keep all follow-up visits as directed by your health care provider.  Take all medicines as directed by your health care provider. SEEK MEDICAL CARE IF:  Your symptoms get worse or you start having new symptoms.  You feel overwhelmed by your problems and can no longer manage them on your own. SEEK IMMEDIATE MEDICAL CARE IF:  You feel like hurting yourself or someone else. Document Released: 08/02/2000 Document Revised: 06/23/2013 Document Reviewed: 10/01/2012 [facility] Patient Information 2015 [place], [place]. This information is not intended to replace advice given to you by your health  care provider. Make sure you discuss any questions you have with your health care provider.

## 2014-12-17 ENCOUNTER — Other Ambulatory Visit: Payer: Self-pay | Admitting: Internal Medicine

## 2014-12-17 DIAGNOSIS — Z1231 Encounter for screening mammogram for malignant neoplasm of breast: Secondary | ICD-10-CM

## 2015-01-04 ENCOUNTER — Ambulatory Visit
Admission: RE | Admit: 2015-01-04 | Discharge: 2015-01-04 | Disposition: A | Discharge status: Home / Self Care | Payer: BC Managed Care – PPO | Source: Ambulatory Visit | Attending: Internal Medicine | Admitting: Internal Medicine

## 2015-01-04 DIAGNOSIS — Z1231 Encounter for screening mammogram for malignant neoplasm of breast: Secondary | ICD-10-CM | POA: Insufficient documentation

## 2015-01-04 HISTORY — DX: Malignant (primary) neoplasm, unspecified: C80.1

## 2015-10-13 ENCOUNTER — Telehealth: Payer: Self-pay | Admitting: Gastroenterology

## 2015-10-13 NOTE — Telephone Encounter (Signed)
Patient needs to wait to see when she will be able to take of work to do this. I gave her the dates, she will call me back when she is ready to schedule

## 2015-10-13 NOTE — Telephone Encounter (Signed)
colonoscopy

## 2015-12-28 ENCOUNTER — Other Ambulatory Visit: Payer: Self-pay | Admitting: Internal Medicine

## 2015-12-28 DIAGNOSIS — Z1231 Encounter for screening mammogram for malignant neoplasm of breast: Secondary | ICD-10-CM

## 2016-01-18 DIAGNOSIS — M19079 Primary osteoarthritis, unspecified ankle and foot: Secondary | ICD-10-CM | POA: Insufficient documentation

## 2016-02-01 ENCOUNTER — Ambulatory Visit: Payer: BC Managed Care – PPO

## 2016-03-07 ENCOUNTER — Ambulatory Visit
Admission: RE | Admit: 2016-03-07 | Discharge: 2016-03-07 | Disposition: A | Discharge status: Home / Self Care | Payer: BC Managed Care – PPO | Source: Ambulatory Visit | Attending: Internal Medicine | Admitting: Internal Medicine

## 2016-03-07 DIAGNOSIS — Z1231 Encounter for screening mammogram for malignant neoplasm of breast: Secondary | ICD-10-CM | POA: Diagnosis not present

## 2016-03-31 ENCOUNTER — Other Ambulatory Visit: Payer: Self-pay | Admitting: Internal Medicine

## 2016-03-31 DIAGNOSIS — R109 Unspecified abdominal pain: Secondary | ICD-10-CM

## 2016-04-06 ENCOUNTER — Ambulatory Visit
Admission: RE | Admit: 2016-04-06 | Discharge: 2016-04-06 | Disposition: A | Discharge status: Home / Self Care | Payer: BC Managed Care – PPO | Source: Ambulatory Visit | Attending: Internal Medicine | Admitting: Internal Medicine

## 2016-04-06 DIAGNOSIS — K760 Fatty (change of) liver, not elsewhere classified: Secondary | ICD-10-CM | POA: Diagnosis not present

## 2016-04-06 DIAGNOSIS — K824 Cholesterolosis of gallbladder: Secondary | ICD-10-CM | POA: Diagnosis not present

## 2016-04-06 DIAGNOSIS — R109 Unspecified abdominal pain: Secondary | ICD-10-CM | POA: Diagnosis present

## 2016-10-09 ENCOUNTER — Other Ambulatory Visit: Payer: Self-pay

## 2016-10-10 ENCOUNTER — Ambulatory Visit (INDEPENDENT_AMBULATORY_CARE_PROVIDER_SITE_OTHER): Payer: BC Managed Care – PPO

## 2016-10-10 ENCOUNTER — Encounter: Payer: Self-pay | Admitting: Podiatry

## 2016-10-10 ENCOUNTER — Ambulatory Visit (INDEPENDENT_AMBULATORY_CARE_PROVIDER_SITE_OTHER): Payer: BC Managed Care – PPO | Admitting: Podiatry

## 2016-10-10 DIAGNOSIS — M79671 Pain in right foot: Secondary | ICD-10-CM | POA: Diagnosis not present

## 2016-10-10 DIAGNOSIS — M778 Other enthesopathies, not elsewhere classified: Secondary | ICD-10-CM

## 2016-10-10 DIAGNOSIS — M779 Enthesopathy, unspecified: Secondary | ICD-10-CM

## 2016-10-10 DIAGNOSIS — M7751 Other enthesopathy of right foot: Secondary | ICD-10-CM | POA: Diagnosis not present

## 2016-10-10 MED ORDER — BETAMETHASONE SOD PHOS & ACET 6 (3-3) MG/ML IJ SUSP: 3.0000 mg | Freq: Once | INTRAMUSCULAR | Status: DC

## 2016-10-10 NOTE — Progress Notes (Signed)
   HPI: 61 year old female presents to the office today for evaluation of right foot pain. His been painful for several years now. Patient has had intermittent pain off and on. She denies any trauma.   Physical Exam: General: The patient is alert and oriented x3 in no acute distress.  Dermatology: Skin is warm, dry and supple bilateral lower extremities. Negative for open lesions or macerations.  Vascular: Neurovascular status intact Palpable pedal pulses bilaterally. No edema or erythema noted. Capillary refill within normal limits.  Neurological: Epicritic and protective threshold grossly intact bilaterally.   Musculoskeletal Exam: Pain on palpation and range of motion to the second MTPJ right foot. Pain on palpation also noted to the midfoot of the right lower extremity. Range of motion within normal limits to all pedal and ankle joints bilateral. Muscle strength 5/5 in all groups bilateral.   Radiographic Exam:  Normal osseous mineralization. 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 No fracture/dislocation/boney destruction.    Assessment: 1. Second MPJ capsulitis right foot 2. Midfoot capsulitis right foot   Plan of Care:  1. Patient was evaluated. X-rays reviewed today 2. Injection of 0.5 mL Celestone Soluspan injected into the Lisfranc joint/mid foot right  3. Injection of 0.5 mL Celestone Soluspan injected in the second MTPJ right foot 4. The patient would greatly benefit from custom molded insoles. The patient has tried over-the-counter insoles that do not help. Today we are going to arrange an appointment with Liliane Channel, Pedorthist for custom molded insoles 5. Return to clinic when necessary   Edrick Kins, DPM [facility]  Dr. Edrick Kins, DPM    [address], [place] [address]                Office [phone]   Fax [phone]

## 2016-11-01 ENCOUNTER — Other Ambulatory Visit: Payer: BC Managed Care – PPO | Admitting: Orthotics

## 2016-11-08 ENCOUNTER — Other Ambulatory Visit: Payer: BC Managed Care – PPO | Admitting: Orthotics

## 2016-11-21 ENCOUNTER — Ambulatory Visit (INDEPENDENT_AMBULATORY_CARE_PROVIDER_SITE_OTHER): Payer: BC Managed Care – PPO | Admitting: Podiatry

## 2016-11-21 DIAGNOSIS — M7751 Other enthesopathy of right foot: Secondary | ICD-10-CM

## 2016-11-21 DIAGNOSIS — M779 Enthesopathy, unspecified: Secondary | ICD-10-CM

## 2016-11-21 DIAGNOSIS — M778 Other enthesopathies, not elsewhere classified: Secondary | ICD-10-CM

## 2016-11-22 NOTE — Progress Notes (Signed)
   HPI: 61 year old female presents to the office today for follow-up evaluation of right foot pain. She states the pain is intermittent and rates it at 4/10. She denies any trauma. She denies any new complaints at this time.   Past Medical History:  Diagnosis Date  . Cancer ([place])    thyroid ca  . Diabetes mellitus without complication ([place])   . Diverticulitis   . Hypertension   . Thyroid disease       Physical Exam: General: The patient is alert and oriented x3 in no acute distress.  Dermatology: Skin is warm, dry and supple bilateral lower extremities. Negative for open lesions or macerations.  Vascular: Neurovascular status intact Palpable pedal pulses bilaterally. No edema or erythema noted. Capillary refill within normal limits.  Neurological: Epicritic and protective threshold grossly intact bilaterally.   Musculoskeletal Exam: Pain on palpation and range of motion to the second MTPJ right foot. Pain on palpation also noted to the midfoot of the right lower extremity. Range of motion within normal limits to all pedal and ankle joints bilateral. Muscle strength 5/5 in all groups bilateral.    Assessment: 1. Second MPJ capsulitis right foot 2. Midfoot capsulitis right foot   Plan of Care:  1. Patient was evaluated.  2. Injection of 0.5 mL Celestone Soluspan injected into the Lisfranc joint/mid foot right  3. Injection of 0.5 mL Celestone Soluspan injected in the second MTPJ right foot 4. Return to clinic when necessary.   Edrick Kins, DPM [facility]  Dr. Edrick Kins, DPM    [address], [place] [address]                Office [phone]  Fax [phone]

## 2016-11-29 MED ORDER — BETAMETHASONE SOD PHOS & ACET 6 (3-3) MG/ML IJ SUSP: 3.0000 mg | Freq: Once | INTRAMUSCULAR | Status: DC

## 2017-01-10 ENCOUNTER — Ambulatory Visit (INDEPENDENT_AMBULATORY_CARE_PROVIDER_SITE_OTHER): Payer: BC Managed Care – PPO | Admitting: Orthotics

## 2017-01-10 DIAGNOSIS — M779 Enthesopathy, unspecified: Secondary | ICD-10-CM

## 2017-01-10 DIAGNOSIS — M7751 Other enthesopathy of right foot: Secondary | ICD-10-CM

## 2017-01-10 DIAGNOSIS — M778 Other enthesopathies, not elsewhere classified: Secondary | ICD-10-CM

## 2017-01-15 NOTE — Progress Notes (Signed)
Patient seen today for evaluation/casting for CMFO.  Patient presents with 2nd MPJ R capsulities.   Plan on semi-rigid device w/ offload 2nd mpj rt.  Richy to fab w/ full length spenco cover.

## 2017-02-06 ENCOUNTER — Ambulatory Visit (INDEPENDENT_AMBULATORY_CARE_PROVIDER_SITE_OTHER): Payer: BC Managed Care – PPO | Admitting: Orthotics

## 2017-02-06 ENCOUNTER — Encounter: Payer: Self-pay | Admitting: Podiatry

## 2017-02-06 DIAGNOSIS — M7751 Other enthesopathy of right foot: Secondary | ICD-10-CM

## 2017-02-06 NOTE — Progress Notes (Signed)
Patient came in today to pick up custom made foot orthotics.  The goals were accomplished and the patient reported no dissatisfaction with said orthotics.  Patient was advised of breakin period and how to report any issues. 

## 2017-02-26 ENCOUNTER — Other Ambulatory Visit: Payer: Self-pay | Admitting: Internal Medicine

## 2017-02-26 DIAGNOSIS — Z1231 Encounter for screening mammogram for malignant neoplasm of breast: Secondary | ICD-10-CM

## 2017-03-09 ENCOUNTER — Ambulatory Visit
Admission: RE | Admit: 2017-03-09 | Discharge: 2017-03-09 | Disposition: A | Discharge status: Home / Self Care | Payer: BC Managed Care – PPO | Source: Ambulatory Visit | Attending: Internal Medicine | Admitting: Internal Medicine

## 2017-03-09 DIAGNOSIS — Z1231 Encounter for screening mammogram for malignant neoplasm of breast: Secondary | ICD-10-CM | POA: Diagnosis present

## 2017-04-24 ENCOUNTER — Ambulatory Visit: Payer: BC Managed Care – PPO | Admitting: Podiatry

## 2017-04-24 ENCOUNTER — Encounter: Payer: Self-pay | Admitting: Podiatry

## 2017-04-24 DIAGNOSIS — M778 Other enthesopathies, not elsewhere classified: Secondary | ICD-10-CM

## 2017-04-24 DIAGNOSIS — M7751 Other enthesopathy of right foot: Secondary | ICD-10-CM

## 2017-04-24 DIAGNOSIS — M779 Enthesopathy, unspecified: Secondary | ICD-10-CM

## 2017-04-24 MED ORDER — MELOXICAM 7.5 MG PO TABS: 7.5000 mg | ORAL_TABLET | Freq: Every day | ORAL | 1 refills | Status: DC

## 2017-04-26 NOTE — Progress Notes (Signed)
   HPI: 62 year old female presents to the office today for follow-up evaluation of right foot pain. She states the pain is still present and is requesting another injection since it seemed to help alleviate the pain last time. She has been wearing her orthotics with no significant relief. Patient is here for further evaluation and treatment.    Past Medical History:  Diagnosis Date  . Cancer ([place])    thyroid ca  . Diabetes mellitus without complication ([place])   . Diverticulitis   . Hypertension   . Thyroid disease       Physical Exam: General: The patient is alert and oriented x3 in no acute distress.  Dermatology: Skin is warm, dry and supple bilateral lower extremities. Negative for open lesions or macerations.  Vascular: Neurovascular status intact Palpable pedal pulses bilaterally. No edema or erythema noted. Capillary refill within normal limits.  Neurological: Epicritic and protective threshold grossly intact bilaterally.   Musculoskeletal Exam: Pain on palpation and range of motion to the second MTPJ right foot. Pain on palpation also noted to the midfoot of the right lower extremity. Range of motion within normal limits to all pedal and ankle joints bilateral. Muscle strength 5/5 in all groups bilateral.    Assessment: 1. Second MPJ capsulitis right foot 2. Midfoot capsulitis right foot   Plan of Care:  1. Patient was evaluated.  2. Injection of 0.5 mL Celestone Soluspan injected into the Lisfranc joint/mid foot right  3. Injection of 0.5 mL Celestone Soluspan injected in the second MTPJ right foot 4. Continue wearing custom molded orthotics and good supportive shoes.  5. Prescription for Mobic 7.5 mg as needed daily. 6. Return to clinic when necessary.  Systems analyst at [employer].    Edrick Kins, DPM [facility]  Dr. Edrick Kins, DPM    [address], [place] [address]                 Office [phone]  Fax [phone]

## 2017-07-02 ENCOUNTER — Other Ambulatory Visit: Payer: Self-pay | Admitting: Podiatry

## 2017-12-03 ENCOUNTER — Other Ambulatory Visit: Payer: Self-pay | Admitting: Podiatry

## 2018-02-11 ENCOUNTER — Ambulatory Visit: Payer: BC Managed Care – PPO | Admitting: Podiatry

## 2018-02-15 ENCOUNTER — Encounter

## 2018-02-15 ENCOUNTER — Ambulatory Visit: Payer: BC Managed Care – PPO | Admitting: Podiatry

## 2018-02-15 ENCOUNTER — Encounter: Payer: Self-pay | Admitting: Podiatry

## 2018-02-15 DIAGNOSIS — M779 Enthesopathy, unspecified: Secondary | ICD-10-CM

## 2018-02-15 DIAGNOSIS — M778 Other enthesopathies, not elsewhere classified: Secondary | ICD-10-CM

## 2018-02-15 DIAGNOSIS — M7751 Other enthesopathy of right foot: Secondary | ICD-10-CM | POA: Diagnosis not present

## 2018-02-15 MED ORDER — MELOXICAM 7.5 MG PO TABS: 7.5000 mg | ORAL_TABLET | Freq: Every day | ORAL | 1 refills | Status: DC

## 2018-02-17 NOTE — Progress Notes (Signed)
   HPI: 62 year old female presents to the office today for follow-up evaluation of right foot pain. She states the pain is starting to return although not as significantly as it was previously. She reports relief after receiving the injection at her previous visit. She is requesting the same treatment today. Patient is here for further evaluation and treatment. klk  Past Medical History:  Diagnosis Date  . Cancer ([place])    thyroid ca  . Diabetes mellitus without complication ([place])   . Diverticulitis   . Hypertension   . Thyroid disease       Physical Exam: General: The patient is alert and oriented x3 in no acute distress.  Dermatology: Skin is warm, dry and supple bilateral lower extremities. Negative for open lesions or macerations.  Vascular: Neurovascular status intact. Palpable pedal pulses bilaterally. No edema or erythema noted. Capillary refill within normal limits.  Neurological: Epicritic and protective threshold grossly intact bilaterally.   Musculoskeletal Exam: Pain on palpation and range of motion to the second MTPJ right foot. Pain on palpation also noted to the midfoot of the right lower extremity. Range of motion within normal limits to all pedal and ankle joints bilateral. Muscle strength 5/5 in all groups bilateral.    Assessment: 1. Second MPJ capsulitis right foot 2. Midfoot capsulitis right foot   Plan of Care:  1. Patient was evaluated.  2. Injection of 0.5 mL Celestone Soluspan injected into the Lisfranc joint/mid foot right  3. Injection of 0.5 mL Celestone Soluspan injected in the second MTPJ right foot 4. Continue wearing custom molded orthotics and good supportive shoes.  5. Refill prescription for Mobic 7.5 mg as needed daily provided to patient. 6. Return to clinic when necessary.  Systems analyst at [employer].    Edrick Kins, DPM [facility]  Dr. Edrick Kins, DPM    [address], [place] [address]                Office [phone]  Fax [phone]

## 2018-02-19 ENCOUNTER — Ambulatory Visit: Payer: BC Managed Care – PPO | Admitting: Podiatry

## 2018-03-01 ENCOUNTER — Other Ambulatory Visit: Payer: Self-pay | Admitting: Internal Medicine

## 2018-03-01 DIAGNOSIS — Z1231 Encounter for screening mammogram for malignant neoplasm of breast: Secondary | ICD-10-CM

## 2018-03-19 ENCOUNTER — Ambulatory Visit
Admission: RE | Admit: 2018-03-19 | Discharge: 2018-03-19 | Disposition: A | Discharge status: Home / Self Care | Payer: BC Managed Care – PPO | Source: Ambulatory Visit | Attending: Internal Medicine | Admitting: Internal Medicine

## 2018-03-19 DIAGNOSIS — Z1231 Encounter for screening mammogram for malignant neoplasm of breast: Secondary | ICD-10-CM | POA: Diagnosis present

## 2018-04-25 ENCOUNTER — Other Ambulatory Visit: Payer: Self-pay | Admitting: Podiatry

## 2018-06-25 ENCOUNTER — Other Ambulatory Visit: Payer: Self-pay

## 2018-06-25 ENCOUNTER — Ambulatory Visit (INDEPENDENT_AMBULATORY_CARE_PROVIDER_SITE_OTHER): Payer: BC Managed Care – PPO

## 2018-06-25 ENCOUNTER — Ambulatory Visit: Payer: BC Managed Care – PPO | Admitting: Podiatry

## 2018-06-25 ENCOUNTER — Encounter: Payer: Self-pay | Admitting: Podiatry

## 2018-06-25 VITALS — Temp 98.5°F

## 2018-06-25 DIAGNOSIS — S99921A Unspecified injury of right foot, initial encounter: Secondary | ICD-10-CM | POA: Diagnosis not present

## 2018-06-25 DIAGNOSIS — M7751 Other enthesopathy of right foot: Secondary | ICD-10-CM

## 2018-06-25 DIAGNOSIS — M779 Enthesopathy, unspecified: Secondary | ICD-10-CM

## 2018-06-25 DIAGNOSIS — M778 Other enthesopathies, not elsewhere classified: Secondary | ICD-10-CM

## 2018-06-25 MED ORDER — MELOXICAM 7.5 MG PO TABS: 7.5000 mg | ORAL_TABLET | Freq: Every day | ORAL | 1 refills | Status: DC

## 2018-07-02 NOTE — Progress Notes (Signed)
   HPI: 63 year old female presents to the office today for follow-up evaluation of right foot pain.  Patient has a new complaint today and patient says that approximately 1 month ago she dropped a pan on her great toe.  Patient did conservative care and she says that the bruising went away however it still giving her problems.  Is very tender today.  She is currently not done anything for treatment  Past Medical History:  Diagnosis Date  . Cancer ([place])    thyroid ca  . Diabetes mellitus without complication ([place])   . Diverticulitis   . Hypertension   . Thyroid disease       Physical Exam: General: The patient is alert and oriented x3 in no acute distress.  Dermatology: Skin is warm, dry and supple bilateral lower extremities. Negative for open lesions or macerations.  Vascular: Neurovascular status intact. Palpable pedal pulses bilaterally. No edema or erythema noted. Capillary refill within normal limits.  Neurological: Epicritic and protective threshold grossly intact bilaterally.   Musculoskeletal Exam: Pain on palpation and range of motion to the second MTPJ right foot. Pain on palpation also noted to the midfoot of the right lower extremity. Range of motion within normal limits to all pedal and ankle joints bilateral. Muscle strength 5/5 in all groups bilateral.   Radiographic exam: Normal osseous mineralization.  No fracture identified.  Joint spaces preserved.   Assessment: 1. Second MPJ capsulitis right foot 2. Midfoot capsulitis right foot 3.  Soft tissue injury right great toe   Plan of Care:  1. Patient was evaluated.  2. Injection of 0.5 mL Celestone Soluspan injected into the Lisfranc joint/mid foot right  3. Injection of 0.5 mL Celestone Soluspan injected in the second MTPJ right foot 4. Continue wearing custom molded orthotics and good supportive shoes.  5. Refill prescription for Mobic 7.5 mg as needed daily provided to patient. 6. Return to clinic when  necessary.  Systems analyst at [employer].    Edrick Kins, DPM [facility]  Dr. Edrick Kins, DPM    [address], [place] [address]                Office [phone]  Fax [phone]

## 2018-10-01 ENCOUNTER — Other Ambulatory Visit: Payer: Self-pay | Admitting: Internal Medicine

## 2018-10-01 DIAGNOSIS — R197 Diarrhea, unspecified: Secondary | ICD-10-CM

## 2018-10-04 ENCOUNTER — Other Ambulatory Visit: Payer: BC Managed Care – PPO

## 2018-10-07 ENCOUNTER — Ambulatory Visit: Admission: RE | Admit: 2018-10-07 | Payer: BC Managed Care – PPO | Source: Ambulatory Visit

## 2018-11-04 ENCOUNTER — Telehealth: Payer: Self-pay | Admitting: Podiatry

## 2018-11-04 DIAGNOSIS — M7751 Other enthesopathy of right foot: Secondary | ICD-10-CM | POA: Diagnosis not present

## 2018-11-04 DIAGNOSIS — M7752 Other enthesopathy of left foot: Secondary | ICD-10-CM | POA: Diagnosis not present

## 2018-11-04 NOTE — Telephone Encounter (Signed)
Pt called and left message stating she would like another pair of inserts ordered..  I returned call and she wants a pair just like the previous pair from 2018. She is aware we are billing the insurance.

## 2018-11-22 ENCOUNTER — Other Ambulatory Visit: Payer: Self-pay | Admitting: Podiatry

## 2018-12-24 ENCOUNTER — Other Ambulatory Visit: Payer: Self-pay

## 2018-12-24 ENCOUNTER — Ambulatory Visit: Payer: BC Managed Care – PPO | Admitting: Gastroenterology

## 2018-12-24 ENCOUNTER — Encounter (INDEPENDENT_AMBULATORY_CARE_PROVIDER_SITE_OTHER): Payer: Self-pay

## 2018-12-24 ENCOUNTER — Encounter: Payer: Self-pay | Admitting: Gastroenterology

## 2018-12-24 VITALS — BP 143/84 | HR 78 | Temp 98.6°F | Ht 66.0 in | Wt 242.6 lb

## 2018-12-24 DIAGNOSIS — R197 Diarrhea, unspecified: Secondary | ICD-10-CM | POA: Diagnosis not present

## 2018-12-24 NOTE — Progress Notes (Signed)
Gastroenterology Consultation  Referring Provider:     Cletis Athens, MD Primary Care Physician:  Jill Athens, MD Primary Gastroenterologist:  Dr. Allen Holmes     Reason for Consultation:     Diarrhea        HPI:   Jill Holmes is a 63 y.o. y/o female referred for consultation & management of diarrhea by Dr. Cletis Athens, MD.  This patient comes in today with a history of diarrhea.  The patient had her last colonoscopy in 2009 by me.  The patient was sent for a colonoscopy back in 2017 but canceled at that time.  The patient now reports that she will get diarrhea and will have to run to the bathroom.  She also has a lot of gas and is very prone to accidents.  The patient denies any unexplained weight loss black stools or bloody stools.  She also denies any family history of colon cancer or colon polyps. She states that she was told that she should increase her protein and has been eating more cheese.  The patient also states that she does have milk and other dairy products.  The patient reports that her diarrhea is also accompanied with gas and bloating.  She states that her diarrhea is only approximately 4 times a month but at those times it is very bothersome.  When she is not having diarrhea she is having normal bowel movements.  Past Medical History:  Diagnosis Date  . Cancer ([place])    thyroid ca  . Diabetes mellitus without complication ([place])   . Diverticulitis   . Hypertension   . Thyroid disease     Past Surgical History:  Procedure Laterality Date  . ABDOMINAL HYSTERECTOMY      Prior to Admission medications   Medication Sig Start Date End Date Taking? Authorizing Provider  amLODipine (NORVASC) 10 MG tablet Take 10 mg by mouth daily. 11/29/18  Yes [provider]  Blood Glucose Monitoring Suppl ([place]) w/Device KIT See admin instructions. 10/26/18  Yes [provider]  clotrimazole-betamethasone (LOTRISONE) cream Apply 1 application  topically 2 (two) times daily. 11/22/18  Yes [provider]  EPINEPHrine 0.3 mg/0.3 mL IJ SOAJ injection Inject 0.3 mg into the skin.   Yes [provider]  gabapentin (NEURONTIN) 100 MG capsule gabapentin 100 mg capsule   Yes [provider]  glimepiride (AMARYL) 2 MG tablet Take 2 mg by mouth daily. 11/22/18  Yes [provider]  GLIPIZIDE PO Take by mouth.   Yes [provider]  glucose blood (BAYER CONTOUR TEST) test strip  06/22/14  Yes [provider]  levothyroxine (SYNTHROID, LEVOTHROID) 150 MCG tablet  07/17/14  Yes [provider]  lovastatin (MEVACOR) 20 MG tablet lovastatin 20 mg tablet   Yes [provider]  meloxicam (MOBIC) 7.5 MG tablet TAKE ONE TABLET BY MOUTH EVERY DAY 11/25/18  Yes Edrick Kins, DPM  metFORMIN (GLUCOPHAGE) 500 MG tablet metformin 500 mg tablet   Yes [provider]  metoprolol succinate (TOPROL-XL) 50 MG 24 hr tablet metoprolol succinate ER 50 mg tablet,extended release 24 hr   Yes [provider]  nystatin cream (MYCOSTATIN) APPLY TO AFFECTED AREAS 3 TIMES DAILY AS NEEDED 09/27/18  Yes [provider]  ondansetron (ZOFRAN) 4 MG tablet Take 1 tablet (4 mg total) by mouth daily as needed for nausea or vomiting. 09/28/14  Yes Earleen Newport, MD  oxyCODONE-acetaminophen (ROXICET) 5-325 MG per tablet Take 1  tablet by mouth every 6 (six) hours as needed. 09/28/14  Yes Earleen Newport, MD  RYBELSUS 3 MG TABS Take 1 tablet by mouth daily. 09/13/18  Yes [provider]  amLODipine (NORVASC) 5 MG tablet  06/24/18   [provider]  lidocaine (XYLOCAINE) 1 % (with preservative) injection by Infiltration route. 08/14/14   [provider]  metFORMIN (GLUCOPHAGE-XR) 500 MG 24 hr tablet TAKE TWO TABLETS BY MOUTH IN THE MORNING WITH BREAKFAST AND ONE TABLET BY MOUTH WITH DINNER 11/02/18   [provider]    Family History  Problem Relation Age  of Onset  . Breast cancer Mother 56     Social History   Tobacco Use  . Smoking status: Never Smoker  . Smokeless tobacco: Never Used  Substance Use Topics  . Alcohol use: No  . Drug use: No    Allergies as of 12/24/2018 - Review Complete 12/24/2018  Allergen Reaction Noted  . Enalapril Anaphylaxis 08/14/2014  . Sulfur Anaphylaxis 08/14/2014  . Lisinopril  09/28/2014  . Sulfa antibiotics  09/28/2014    Review of Systems:    All systems reviewed and negative except where noted in HPI.   Physical Exam:  BP (!) 143/84 (BP Location: Left Arm, Patient Position: Sitting)   Pulse 78   Temp 98.6 F (37 C) (Oral)   Ht '5\' 6"'$  (1.676 m)   Wt 242 lb 9.6 oz (110 kg)   BMI 39.16 kg/m  No LMP recorded. Patient has had a hysterectomy. General:   Alert,  Well-developed, well-nourished, pleasant and cooperative in NAD Head:  Normocephalic and atraumatic. Eyes:  Sclera clear, no icterus.   Conjunctiva pink. Ears:  Normal auditory acuity. Neck:  Supple; no masses or thyromegaly. Lungs:  Respirations even and unlabored.  Clear throughout to auscultation.   No wheezes, crackles, or rhonchi. No acute distress. Heart:  Regular rate and rhythm; no murmurs, clicks, rubs, or gallops. Abdomen:  Normal bowel sounds.  No bruits.  Soft, non-tender and non-distended without masses, hepatosplenomegaly or hernias noted.  No guarding or rebound tenderness.  Negative Carnett sign.   Rectal:  Deferred.  Msk:  Symmetrical without gross deformities.  Good, equal movement & strength bilaterally. Pulses:  Normal pulses noted. Extremities:  No clubbing or edema.  No cyanosis. Neurologic:  Alert and oriented x3;  grossly normal neurologically. Skin:  Intact without significant lesions or rashes.  No jaundice. Lymph Nodes:  No significant cervical adenopathy. Psych:  Alert and cooperative. Normal mood and affect.  Imaging Studies: No results found.  Assessment and Plan:   Jill Holmes is a 63 y.o. y/o  female who comes in today with a history of diarrhea which she reports to be occurring approximately 4 times a month.  The patient states that when she has it she has urgency and is finding that she may not make it to the bathroom.  It is accompanied with gas and bloating.  The patient has been told to try and avoid dairy products to see if that helps her symptoms.  The patient is also due for a screening colonoscopy and will be set up for a screening colonoscopy.  The patient will have biopsies taken of her colon if she reports that milk avoidance does not help her symptoms. I have discussed risks & benefits which include, but are not limited to, bleeding, infection, perforation & drug reaction.  The patient agrees with this plan & written consent will be obtained.  Lucilla Lame, MD. Marval Regal    Note: This dictation was prepared with Dragon dictation along with smaller phrase technology. Any transcriptional errors that result from this process are unintentional.

## 2018-12-27 LAB — HM PAP SMEAR: HM Pap smear: NORMAL

## 2019-01-01 ENCOUNTER — Telehealth: Payer: Self-pay | Admitting: Gastroenterology

## 2019-01-01 NOTE — Telephone Encounter (Signed)
Left vm to schedule pt a colonoscopy.

## 2019-01-01 NOTE — Telephone Encounter (Signed)
Patient called & would like to have a colonoscopy the 2nd or 3rd week in December 2020.

## 2019-01-02 ENCOUNTER — Other Ambulatory Visit: Payer: Self-pay

## 2019-01-02 DIAGNOSIS — R197 Diarrhea, unspecified: Secondary | ICD-10-CM

## 2019-01-02 NOTE — Telephone Encounter (Signed)
Pt has been scheduled for a colonoscopy at [facility] on 02/07/19.

## 2019-01-03 ENCOUNTER — Other Ambulatory Visit: Payer: Self-pay | Admitting: Internal Medicine

## 2019-01-03 DIAGNOSIS — S86911A Strain of unspecified muscle(s) and tendon(s) at lower leg level, right leg, initial encounter: Secondary | ICD-10-CM

## 2019-02-04 ENCOUNTER — Other Ambulatory Visit
Admission: RE | Admit: 2019-02-04 | Discharge: 2019-02-04 | Disposition: A | Discharge status: Home / Self Care | Payer: BC Managed Care – PPO | Source: Ambulatory Visit | Attending: Gastroenterology | Admitting: Gastroenterology

## 2019-02-04 DIAGNOSIS — Z01812 Encounter for preprocedural laboratory examination: Secondary | ICD-10-CM | POA: Insufficient documentation

## 2019-02-04 DIAGNOSIS — Z20828 Contact with and (suspected) exposure to other viral communicable diseases: Secondary | ICD-10-CM | POA: Insufficient documentation

## 2019-02-04 LAB — SARS CORONAVIRUS 2 (TAT 6-24 HRS): SARS Coronavirus 2: NEGATIVE

## 2019-02-07 ENCOUNTER — Ambulatory Visit: Payer: BC Managed Care – PPO | Admitting: Registered Nurse

## 2019-02-07 ENCOUNTER — Other Ambulatory Visit: Payer: Self-pay

## 2019-02-07 ENCOUNTER — Encounter: Payer: Self-pay | Admitting: Gastroenterology

## 2019-02-07 ENCOUNTER — Encounter
Admission: RE | Disposition: A | Discharge status: Home / Self Care | Payer: Self-pay | Source: Home / Self Care | Attending: Gastroenterology

## 2019-02-07 ENCOUNTER — Ambulatory Visit
Admission: RE | Admit: 2019-02-07 | Discharge: 2019-02-07 | Disposition: A | Discharge status: Home / Self Care | Payer: BC Managed Care – PPO | Attending: Gastroenterology | Admitting: Gastroenterology

## 2019-02-07 DIAGNOSIS — Z888 Allergy status to other drugs, medicaments and biological substances status: Secondary | ICD-10-CM | POA: Diagnosis not present

## 2019-02-07 DIAGNOSIS — D123 Benign neoplasm of transverse colon: Secondary | ICD-10-CM | POA: Diagnosis not present

## 2019-02-07 DIAGNOSIS — Z791 Long term (current) use of non-steroidal anti-inflammatories (NSAID): Secondary | ICD-10-CM | POA: Diagnosis not present

## 2019-02-07 DIAGNOSIS — Z7984 Long term (current) use of oral hypoglycemic drugs: Secondary | ICD-10-CM | POA: Insufficient documentation

## 2019-02-07 DIAGNOSIS — Z85850 Personal history of malignant neoplasm of thyroid: Secondary | ICD-10-CM | POA: Diagnosis not present

## 2019-02-07 DIAGNOSIS — K635 Polyp of colon: Secondary | ICD-10-CM

## 2019-02-07 DIAGNOSIS — Z79899 Other long term (current) drug therapy: Secondary | ICD-10-CM | POA: Insufficient documentation

## 2019-02-07 DIAGNOSIS — D122 Benign neoplasm of ascending colon: Secondary | ICD-10-CM | POA: Diagnosis not present

## 2019-02-07 DIAGNOSIS — Z90710 Acquired absence of both cervix and uterus: Secondary | ICD-10-CM | POA: Diagnosis not present

## 2019-02-07 DIAGNOSIS — Z1211 Encounter for screening for malignant neoplasm of colon: Secondary | ICD-10-CM | POA: Diagnosis not present

## 2019-02-07 DIAGNOSIS — Z882 Allergy status to sulfonamides status: Secondary | ICD-10-CM | POA: Insufficient documentation

## 2019-02-07 DIAGNOSIS — E119 Type 2 diabetes mellitus without complications: Secondary | ICD-10-CM | POA: Insufficient documentation

## 2019-02-07 DIAGNOSIS — K5730 Diverticulosis of large intestine without perforation or abscess without bleeding: Secondary | ICD-10-CM | POA: Diagnosis not present

## 2019-02-07 DIAGNOSIS — D124 Benign neoplasm of descending colon: Secondary | ICD-10-CM | POA: Diagnosis not present

## 2019-02-07 DIAGNOSIS — Z803 Family history of malignant neoplasm of breast: Secondary | ICD-10-CM | POA: Diagnosis not present

## 2019-02-07 DIAGNOSIS — I10 Essential (primary) hypertension: Secondary | ICD-10-CM | POA: Insufficient documentation

## 2019-02-07 DIAGNOSIS — R197 Diarrhea, unspecified: Secondary | ICD-10-CM

## 2019-02-07 HISTORY — PX: COLONOSCOPY WITH PROPOFOL: SHX5780

## 2019-02-07 LAB — GLUCOSE, CAPILLARY: Glucose-Capillary: 168 mg/dL — ABNORMAL HIGH (ref 70–99)

## 2019-02-07 SURGERY — COLONOSCOPY WITH PROPOFOL
Anesthesia: General

## 2019-02-07 MED ORDER — PROPOFOL 500 MG/50ML IV EMUL
INTRAVENOUS | Status: DC | PRN
  Administered 2019-02-07: 140 ug/kg/min via INTRAVENOUS

## 2019-02-07 MED ORDER — PROPOFOL 10 MG/ML IV BOLUS
INTRAVENOUS | Status: DC | PRN
  Administered 2019-02-07: 70 mg via INTRAVENOUS
  Administered 2019-02-07: 40 mg via INTRAVENOUS
  Administered 2019-02-07: 30 mg via INTRAVENOUS

## 2019-02-07 MED ORDER — PROPOFOL 500 MG/50ML IV EMUL
INTRAVENOUS | Status: AC
  Filled 2019-02-07: qty 50

## 2019-02-07 MED ORDER — LIDOCAINE HCL (PF) 2 % IJ SOLN
INTRAMUSCULAR | Status: AC
  Filled 2019-02-07: qty 5

## 2019-02-07 MED ORDER — PROPOFOL 500 MG/50ML IV EMUL
INTRAVENOUS | Status: AC
  Filled 2019-02-07: qty 200

## 2019-02-07 MED ORDER — LIDOCAINE HCL (PF) 2 % IJ SOLN
INTRAMUSCULAR | Status: AC
  Filled 2019-02-07: qty 10

## 2019-02-07 MED ORDER — SODIUM CHLORIDE 0.9 % IV SOLN
INTRAVENOUS | Status: DC
  Administered 2019-02-07: 07:00:00 1000 mL via INTRAVENOUS

## 2019-02-07 NOTE — Anesthesia Post-op Follow-up Note (Signed)
Anesthesia QCDR form completed.        

## 2019-02-07 NOTE — Anesthesia Postprocedure Evaluation (Signed)
Anesthesia Post Note  Patient: VILETTA SEDA  Procedure(s) Performed: COLONOSCOPY WITH PROPOFOL (N/A )  Patient location during evaluation: Endoscopy Anesthesia Type: General Level of consciousness: awake and alert Pain management: pain level controlled Vital Signs Assessment: post-procedure vital signs reviewed and stable Respiratory status: spontaneous breathing, nonlabored ventilation, respiratory function stable and patient connected to nasal cannula oxygen Cardiovascular status: blood pressure returned to baseline and stable Postop Assessment: no apparent nausea or vomiting Anesthetic complications: no     Last Vitals:  Vitals:   02/07/19 0842 02/07/19 0852  BP: 130/88 (!) 134/92  Pulse: 82 79  Resp: 17 15  Temp:    SpO2: 97% 96%    Last Pain:  Vitals:   02/07/19 0852  TempSrc:   PainSc: 0-No pain                 Martha Clan

## 2019-02-07 NOTE — H&P (Signed)
Lucilla Lame, MD [facility] [address] [place], [place] [address] Phone: [phone] Fax : [phone]  Primary Care Physician:  Cletis Athens, MD Primary Gastroenterologist:  Dr. Allen Norris  Pre-Procedure History & Physical: HPI:  Jill Holmes is a 63 y.o. female is here for a screening colonoscopy.   Past Medical History:  Diagnosis Date  . Cancer ([place])    thyroid ca  . Diabetes mellitus without complication ([place])   . Diverticulitis   . Hypertension   . Thyroid disease     Past Surgical History:  Procedure Laterality Date  . ABDOMINAL HYSTERECTOMY      Prior to Admission medications   Medication Sig Start Date End Date Taking? Authorizing Provider  amLODipine (NORVASC) 10 MG tablet Take 10 mg by mouth daily. 11/29/18  Yes [provider]  amLODipine (NORVASC) 5 MG tablet  06/24/18  Yes [provider]  Blood Glucose Monitoring Suppl ([place]) w/Device KIT See admin instructions. 10/26/18  Yes [provider]  clotrimazole-betamethasone (LOTRISONE) cream Apply 1 application topically 2 (two) times daily. 11/22/18  Yes [provider]  EPINEPHrine 0.3 mg/0.3 mL IJ SOAJ injection Inject 0.3 mg into the skin.   Yes [provider]  gabapentin (NEURONTIN) 100 MG capsule gabapentin 100 mg capsule   Yes [provider]  glimepiride (AMARYL) 2 MG tablet Take 2 mg by mouth daily. 11/22/18  Yes [provider]  GLIPIZIDE PO Take by mouth.   Yes [provider]  glucose blood (BAYER CONTOUR TEST) test strip  06/22/14  Yes [provider]  levothyroxine (SYNTHROID, LEVOTHROID) 150 MCG tablet  07/17/14  Yes [provider]  lidocaine (XYLOCAINE) 1 % (with preservative) injection by Infiltration route. 08/14/14  Yes [provider]  lovastatin (MEVACOR) 20 MG tablet lovastatin 20 mg tablet   Yes [provider]  meloxicam (MOBIC) 7.5 MG tablet TAKE ONE TABLET BY MOUTH  EVERY DAY 11/25/18  Yes Edrick Kins, DPM  metFORMIN (GLUCOPHAGE) 500 MG tablet metformin 500 mg tablet   Yes [provider]  metFORMIN (GLUCOPHAGE-XR) 500 MG 24 hr tablet TAKE TWO TABLETS BY MOUTH IN THE MORNING WITH BREAKFAST AND ONE TABLET BY MOUTH WITH DINNER 11/02/18  Yes [provider]  metoprolol succinate (TOPROL-XL) 50 MG 24 hr tablet metoprolol succinate ER 50 mg tablet,extended release 24 hr   Yes [provider]  nystatin cream (MYCOSTATIN) APPLY TO AFFECTED AREAS 3 TIMES DAILY AS NEEDED 09/27/18  Yes [provider]  ondansetron (ZOFRAN) 4 MG tablet Take 1 tablet (4 mg total) by mouth daily as needed for nausea or vomiting. 09/28/14  Yes Earleen Newport, MD  oxyCODONE-acetaminophen (ROXICET) 5-325 MG per tablet Take 1 tablet by mouth every 6 (six) hours as needed. 09/28/14  Yes Earleen Newport, MD  RYBELSUS 3 MG TABS Take 1 tablet by mouth daily. 09/13/18  Yes [provider]    Allergies as of 01/04/2019 - Review Complete 12/24/2018  Allergen Reaction Noted  . Enalapril Anaphylaxis 08/14/2014  . Sulfur Anaphylaxis 08/14/2014  . Lisinopril  09/28/2014  . Sulfa antibiotics  09/28/2014    Family History  Problem Relation Age of Onset  . Breast cancer Mother 34    Social History   Socioeconomic History  . Marital status: Married    Spouse name: Not on file  . Number of children: Not on file  . Years of education: Not on file  . Highest education level: Not on  file  Occupational History  . Not on file  Tobacco Use  . Smoking status: Never Smoker  . Smokeless tobacco: Never Used  Substance and Sexual Activity  . Alcohol use: No  . Drug use: No  . Sexual activity: Yes    Birth control/protection: None  Other Topics Concern  . Not on file  Social History Narrative  . Not on file   Social Determinants of Health   Financial Resource Strain:   . Difficulty of Paying Living Expenses: Not on file  Food Insecurity:     . Worried About Charity fundraiser in the Last Year: Not on file  . Ran Out of Food in the Last Year: Not on file  Transportation Needs:   . Lack of Transportation (Medical): Not on file  . Lack of Transportation (Non-Medical): Not on file  Physical Activity:   . Days of Exercise per Week: Not on file  . Minutes of Exercise per Session: Not on file  Stress:   . Feeling of Stress : Not on file  Social Connections:   . Frequency of Communication with Friends and Family: Not on file  . Frequency of Social Gatherings with Friends and Family: Not on file  . Attends Religious Services: Not on file  . Active Member of Clubs or Organizations: Not on file  . Attends Archivist Meetings: Not on file  . Marital Status: Not on file  Intimate Partner Violence:   . Fear of Current or Ex-Partner: Not on file  . Emotionally Abused: Not on file  . Physically Abused: Not on file  . Sexually Abused: Not on file    Review of Systems: See HPI, otherwise negative ROS  Physical Exam: BP (!) 156/86   Pulse 91   Temp 97.8 F (36.6 C) (Temporal)   Resp 16   Ht '5\' 6"'  (1.676 m)   Wt 99.8 kg   SpO2 98%   BMI 35.51 kg/m  General:   Alert,  pleasant and cooperative in NAD Head:  Normocephalic and atraumatic. Neck:  Supple; no masses or thyromegaly. Lungs:  Clear throughout to auscultation.    Heart:  Regular rate and rhythm. Abdomen:  Soft, nontender and nondistended. Normal bowel sounds, without guarding, and without rebound.   Neurologic:  Alert and  oriented x4;  grossly normal neurologically.  Impression/Plan: Jill Holmes is now here to undergo a screening colonoscopy.  Risks, benefits, and alternatives regarding colonoscopy have been reviewed with the patient.  Questions have been answered.  All parties agreeable.

## 2019-02-07 NOTE — Op Note (Signed)
[facility] Gastroenterology Patient Name: Jill Holmes Procedure Date: 02/07/2019 7:59 AM MRN: [id number] Account #: [id number] Date of Birth: 1955/07/27 Admit Type: Outpatient Age: 63 Room: [facility] ENDO ROOM 4 Gender: Female Note Status: Finalized Procedure:             Colonoscopy Indications:           Screening for colorectal malignant neoplasm Providers:             Lucilla Lame MD, MD Referring MD:          Cletis Athens, MD (Referring MD) Medicines:             Propofol per Anesthesia Complications:         No immediate complications. Procedure:             Pre-Anesthesia Assessment:                        - Prior to the procedure, a History and Physical was                         performed, and patient medications and allergies were                         reviewed. The patient's tolerance of previous                         anesthesia was also reviewed. The risks and benefits                         of the procedure and the sedation options and risks                         were discussed with the patient. All questions were                         answered, and informed consent was obtained. Prior                         Anticoagulants: The patient has taken no previous                         anticoagulant or antiplatelet agents. ASA Grade                         Assessment: II - A patient with mild systemic disease.                         After reviewing the risks and benefits, the patient                         was deemed in satisfactory condition to undergo the                         procedure.                        After obtaining informed consent, the colonoscope was  passed under direct vision. Throughout the procedure,                         the patient's blood pressure, pulse, and oxygen                         saturations were monitored continuously. The                         Colonoscope was introduced through the  anus and                         advanced to the the cecum, identified by appendiceal                         orifice and ileocecal valve. The colonoscopy was                         performed without difficulty. The patient tolerated                         the procedure well. The quality of the bowel                         preparation was fair. Findings:      The perianal and digital rectal examinations were normal.      A 5 mm polyp was found in the ascending colon. The polyp was sessile.       The polyp was removed with a cold snare. Resection and retrieval were       complete.      A 4 mm polyp was found in the transverse colon. The polyp was sessile.       The polyp was removed with a cold snare. Resection and retrieval were       complete.      A 3 mm polyp was found in the transverse colon. The polyp was sessile.       The polyp was removed with a cold biopsy forceps. Resection and       retrieval were complete.      Two sessile polyps were found in the descending colon. The polyps were 5       to 6 mm in size. These polyps were removed with a cold snare. Resection       and retrieval were complete.      Multiple small-mouthed diverticula were found in the sigmoid colon. Impression:            - Preparation of the colon was fair.                        - One 5 mm polyp in the ascending colon, removed with                         a cold snare. Resected and retrieved.                        - One 4 mm polyp in the transverse colon, removed with  a cold snare. Resected and retrieved.                        - One 3 mm polyp in the transverse colon, removed with                         a cold biopsy forceps. Resected and retrieved.                        - Two 5 to 6 mm polyps in the descending colon,                         removed with a cold snare. Resected and retrieved.                        - Diverticulosis in the sigmoid colon. Recommendation:         - Discharge patient to home.                        - Resume previous diet.                        - Continue present medications.                        - Await pathology results.                        - Repeat colonoscopy in 3 years for surveillance. Procedure Code(s):     --- Professional ---                        [phone], Colonoscopy, flexible; with removal of                         tumor(s), polyp(s), or other lesion(s) by snare                         technique                        45380, 25, Colonoscopy, flexible; with biopsy, single                         or multiple Diagnosis Code(s):     --- Professional ---                        Z12.11, Encounter for screening for malignant neoplasm                         of colon                        K63.5, Polyp of colon CPT copyright 2019 American Medical Association. All rights reserved. The codes documented in this report are preliminary and upon coder review may  be revised to meet current compliance requirements. Lucilla Lame MD, MD 02/07/2019 8:53:03 AM This report has been signed electronically. Number of Addenda: 0 Note Initiated On: 02/07/2019 7:59 AM Scope Withdrawal Time: 0 hours 15 minutes 21 seconds  Total Procedure Duration:  0 hours 23 minutes 22 seconds  Estimated Blood Loss:  Estimated blood loss: none.      [facility]

## 2019-02-07 NOTE — Anesthesia Preprocedure Evaluation (Signed)
Anesthesia Evaluation  Patient identified by MRN, date of birth, ID band Patient awake    Reviewed: Allergy & Precautions, H&P , NPO status , Patient's Chart, lab work & pertinent test results, reviewed documented beta blocker date and time   History of Anesthesia Complications Negative for: history of anesthetic complications  Airway Mallampati: I  TM Distance: >3 FB Neck ROM: full    Dental  (+) Dental Advidsory Given, Teeth Intact, Missing   Pulmonary neg pulmonary ROS,    Pulmonary exam normal        Cardiovascular Exercise Tolerance: Good hypertension, (-) angina(-) Past MI and (-) Cardiac Stents Normal cardiovascular exam(-) dysrhythmias (-) Valvular Problems/Murmurs     Neuro/Psych negative neurological ROS  negative psych ROS   GI/Hepatic negative GI ROS, Neg liver ROS,   Endo/Other  diabetesHypothyroidism   Renal/GU negative Renal ROS  negative genitourinary   Musculoskeletal   Abdominal   Peds  Hematology negative hematology ROS (+)   Anesthesia Other Findings Past Medical History: No date: Cancer ([place])     Comment:  thyroid ca No date: Diabetes mellitus without complication ([facility]) No date: Diverticulitis No date: Hypertension No date: Thyroid disease   Reproductive/Obstetrics negative OB ROS                             Anesthesia Physical Anesthesia Plan  ASA: II  Anesthesia Plan: General   Post-op Pain Management:    Induction: Intravenous  PONV Risk Score and Plan: 3 and Propofol infusion and TIVA  Airway Management Planned: Natural Airway and Nasal Cannula  Additional Equipment:   Intra-op Plan:   Post-operative Plan:   Informed Consent: I have reviewed the patients History and Physical, chart, labs and discussed the procedure including the risks, benefits and alternatives for the proposed anesthesia with the patient or authorized representative who has  indicated his/her understanding and acceptance.     Dental Advisory Given  Plan Discussed with: Anesthesiologist, CRNA and Surgeon  Anesthesia Plan Comments:         Anesthesia Quick Evaluation

## 2019-02-07 NOTE — Transfer of Care (Signed)
Immediate Anesthesia Transfer of Care Note  Patient: Jill Holmes  Procedure(s) Performed: COLONOSCOPY WITH PROPOFOL (N/A )  Patient Location: PACU  Anesthesia Type:General  Level of Consciousness: awake, alert  and oriented  Airway & Oxygen Therapy: Patient Spontanous Breathing  Post-op Assessment: Report given to RN and Post -op Vital signs reviewed and stable  Post vital signs: Reviewed and stable  Last Vitals:  Vitals Value Taken Time  BP 127/59 02/07/19 0834  Temp    Pulse 88 02/07/19 0834  Resp 21 02/07/19 0834  SpO2 95 % 02/07/19 0834    Last Pain:  Vitals:   02/07/19 0834  TempSrc:   PainSc: 0-No pain         Complications: No apparent anesthesia complications

## 2019-02-10 ENCOUNTER — Encounter: Payer: Self-pay | Admitting: Gastroenterology

## 2019-02-10 ENCOUNTER — Encounter: Payer: Self-pay | Admitting: *Deleted

## 2019-02-10 LAB — SURGICAL PATHOLOGY

## 2019-03-27 ENCOUNTER — Other Ambulatory Visit: Payer: Self-pay | Admitting: Internal Medicine

## 2019-03-27 DIAGNOSIS — Z1231 Encounter for screening mammogram for malignant neoplasm of breast: Secondary | ICD-10-CM

## 2019-04-07 ENCOUNTER — Ambulatory Visit
Admission: RE | Admit: 2019-04-07 | Discharge: 2019-04-07 | Disposition: A | Discharge status: Home / Self Care | Payer: BC Managed Care – PPO | Source: Ambulatory Visit | Attending: Internal Medicine | Admitting: Internal Medicine

## 2019-04-07 ENCOUNTER — Other Ambulatory Visit: Payer: Self-pay | Admitting: Internal Medicine

## 2019-04-07 ENCOUNTER — Ambulatory Visit: Payer: BC Managed Care – PPO

## 2019-04-07 DIAGNOSIS — M25561 Pain in right knee: Secondary | ICD-10-CM

## 2019-04-07 DIAGNOSIS — S86911A Strain of unspecified muscle(s) and tendon(s) at lower leg level, right leg, initial encounter: Secondary | ICD-10-CM

## 2019-05-02 ENCOUNTER — Ambulatory Visit
Admission: RE | Admit: 2019-05-02 | Discharge: 2019-05-02 | Disposition: A | Discharge status: Home / Self Care | Payer: BC Managed Care – PPO | Source: Ambulatory Visit | Attending: Internal Medicine | Admitting: Internal Medicine

## 2019-05-02 DIAGNOSIS — Z1231 Encounter for screening mammogram for malignant neoplasm of breast: Secondary | ICD-10-CM | POA: Diagnosis present

## 2019-05-20 ENCOUNTER — Ambulatory Visit: Payer: BC Managed Care – PPO | Admitting: Podiatry

## 2019-05-20 ENCOUNTER — Other Ambulatory Visit: Payer: Self-pay

## 2019-05-20 DIAGNOSIS — M7751 Other enthesopathy of right foot: Secondary | ICD-10-CM | POA: Diagnosis not present

## 2019-05-20 DIAGNOSIS — M778 Other enthesopathies, not elsewhere classified: Secondary | ICD-10-CM | POA: Diagnosis not present

## 2019-05-22 NOTE — Progress Notes (Signed)
   HPI: 64 year old female presents to the office today for follow-up evaluation of right foot pain. She states the pain started flaring up about one month ago. She has been elevating the foot and taking Tylenol and Meloxicam for treatment. She continues to use her orthotics. She reports the last injection she received helped alleviate her symptoms. Walking and being on the foot increases the pain. Patient is here for further evaluation and treatment.   Past Medical History:  Diagnosis Date  . Cancer ([place])    thyroid ca  . Diabetes mellitus without complication ([place])   . Diverticulitis   . Hypertension   . Thyroid disease       Physical Exam: General: The patient is alert and oriented x3 in no acute distress.  Dermatology: Skin is warm, dry and supple bilateral lower extremities. Negative for open lesions or macerations.  Vascular: Neurovascular status intact. Palpable pedal pulses bilaterally. No edema or erythema noted. Capillary refill within normal limits.  Neurological: Epicritic and protective threshold grossly intact bilaterally.   Musculoskeletal Exam: Pain on palpation and range of motion to the second MTPJ right foot. Pain on palpation also noted to the midfoot of the right lower extremity. Range of motion within normal limits to all pedal and ankle joints bilateral. Muscle strength 5/5 in all groups bilateral.    Assessment: 1. Second MPJ capsulitis right foot 2. Midfoot capsulitis right foot  Plan of Care:  1. Patient was evaluated.  2. Injection of 0.5 mL Celestone Soluspan injected into the Lisfranc joint/mid foot right  3. Injection of 0.5 mL Celestone Soluspan injected in the second MTPJ right foot 4. Continue taking Meloxicam as directed by PCP.  5. Continue taking Gabapentin as directed by PCP.  6. Return to clinic as needed.   Systems analyst at [employer].    Edrick Kins, DPM [facility]  Dr. Edrick Kins, DPM    [address]  [place], [place] [address]                Office [phone]  Fax [phone]

## 2019-05-26 MED ORDER — MELOXICAM 7.5 MG PO TABS: 7.5000 mg | ORAL_TABLET | Freq: Every day | ORAL | 0 refills | Status: DC

## 2019-07-07 ENCOUNTER — Other Ambulatory Visit: Payer: Self-pay | Admitting: Podiatry

## 2019-07-07 ENCOUNTER — Other Ambulatory Visit: Payer: Self-pay | Admitting: Internal Medicine

## 2019-07-07 ENCOUNTER — Other Ambulatory Visit: Payer: Self-pay

## 2019-07-23 ENCOUNTER — Other Ambulatory Visit: Payer: Self-pay | Admitting: Internal Medicine

## 2019-09-09 ENCOUNTER — Other Ambulatory Visit: Payer: Self-pay | Admitting: Podiatry

## 2019-09-12 ENCOUNTER — Encounter: Payer: Self-pay | Admitting: Internal Medicine

## 2019-09-12 ENCOUNTER — Ambulatory Visit (INDEPENDENT_AMBULATORY_CARE_PROVIDER_SITE_OTHER): Payer: BC Managed Care – PPO | Admitting: Internal Medicine

## 2019-09-12 ENCOUNTER — Other Ambulatory Visit: Payer: Self-pay

## 2019-09-12 VITALS — BP 134/79 | HR 77 | Ht 66.0 in | Wt 233.4 lb

## 2019-09-12 DIAGNOSIS — E119 Type 2 diabetes mellitus without complications: Secondary | ICD-10-CM | POA: Insufficient documentation

## 2019-09-12 DIAGNOSIS — M19079 Primary osteoarthritis, unspecified ankle and foot: Secondary | ICD-10-CM

## 2019-09-12 DIAGNOSIS — Z85850 Personal history of malignant neoplasm of thyroid: Secondary | ICD-10-CM

## 2019-09-12 DIAGNOSIS — I10 Essential (primary) hypertension: Secondary | ICD-10-CM | POA: Diagnosis not present

## 2019-09-12 DIAGNOSIS — E782 Mixed hyperlipidemia: Secondary | ICD-10-CM | POA: Insufficient documentation

## 2019-09-12 DIAGNOSIS — E1169 Type 2 diabetes mellitus with other specified complication: Secondary | ICD-10-CM | POA: Diagnosis not present

## 2019-09-12 NOTE — Assessment & Plan Note (Signed)
-   Today, the patient's blood pressure is well managed on amlodapine. - The patient will continue the current treatment regimen.  - I encouraged the patient to eat a low-sodium diet to help control blood pressure. - I encouraged the patient to live an active lifestyle and complete activities that increases heart rate to 85% target heart rate at least 5 times per week for one hour.     

## 2019-09-12 NOTE — Assessment & Plan Note (Signed)
Ref to podiatrist

## 2019-09-12 NOTE — Progress Notes (Signed)
Established Patient Office Visit  SUBJECTIVE:  Subjective  Patient ID: Jill Holmes, female    DOB: Jun 20, 1955  Age: 64 y.o. MRN: [id number]  CC:  Chief Complaint  Patient presents with  . Diabetes    blood sugar 126   HPI Jill Holmes is a 64 y.o. female presenting today for general follow-up. She continues to report right knee pain that occasionally radiates down her leg, but only takes pain medication minimally. She received a shot in the knee in [place] that did provide relief. She denies posterior knee pain. She also reports having plantar fasciitis and arthritis in her right foot. She reports having fallen asleep one month ago and woke up feeling "funny", which lead to a panic attack. She called EMS, who evaluated the patient at home and stated that she was hypertensive. After speaking with EMS, the patient reports having calmed down and thus was not transported to the hospital. She states that she has been staying busy by working in order to keep her mind clear.  Blood sugars have been within normal limits around 126. She takes Metformin and Glyburide and has been tolerating them well. The patient reports trying to lose weight but cannot tell if she has actually lost any. She is going to begin a water aerobics class in the near future.  Mammogram and colonoscopy are up to date.    Past Medical History:  Diagnosis Date  . Cancer ([place])    thyroid ca  . Diabetes mellitus without complication ([place])   . Diverticulitis   . Hypertension   . Thyroid disease     Past Surgical History:  Procedure Laterality Date  . ABDOMINAL HYSTERECTOMY    . COLONOSCOPY WITH PROPOFOL N/A 02/07/2019   Procedure: COLONOSCOPY WITH PROPOFOL;  Surgeon: Lucilla Lame, MD;  Location: [facility] ENDOSCOPY;  Service: Endoscopy;  Laterality: N/A;    Family History  Problem Relation Age of Onset  . Breast cancer Mother 80    Social History   Socioeconomic History  . Marital status: Married     Spouse name: Not on file  . Number of children: Not on file  . Years of education: Not on file  . Highest education level: Not on file  Occupational History  . Not on file  Tobacco Use  . Smoking status: Never Smoker  . Smokeless tobacco: Never Used  Substance and Sexual Activity  . Alcohol use: No  . Drug use: No  . Sexual activity: Yes    Birth control/protection: None  Other Topics Concern  . Not on file  Social History Narrative  . Not on file   Social Determinants of Health   Financial Resource Strain:   . Difficulty of Paying Living Expenses:   Food Insecurity:   . Worried About Charity fundraiser in the Last Year:   . Arboriculturist in the Last Year:   Transportation Needs:   . Film/video editor (Medical):   Marland Kitchen Lack of Transportation (Non-Medical):   Physical Activity:   . Days of Exercise per Week:   . Minutes of Exercise per Session:   Stress:   . Feeling of Stress :   Social Connections:   . Frequency of Communication with Friends and Family:   . Frequency of Social Gatherings with Friends and Family:   . Attends Religious Services:   . Active Member of Clubs or Organizations:   . Attends Archivist Meetings:   Marland Kitchen Marital Status:  Intimate Partner Violence:   . Fear of Current or Ex-Partner:   . Emotionally Abused:   Marland Kitchen Physically Abused:   . Sexually Abused:      Current Outpatient Medications:  .  amLODipine (NORVASC) 10 MG tablet, TAKE ONE TABLET EVERY DAY, Disp: 30 tablet, Rfl: 6 .  Blood Glucose Monitoring Suppl (ONETOUCH VERIO FLEX SYSTEM) w/Device KIT, See admin instructions., Disp: , Rfl:  .  clotrimazole-betamethasone (LOTRISONE) cream, Apply 1 application topically 2 (two) times daily., Disp: , Rfl:  .  EPINEPHrine 0.3 mg/0.3 mL IJ SOAJ injection, Inject 0.3 mg into the skin., Disp: , Rfl:  .  gabapentin (NEURONTIN) 100 MG capsule, gabapentin 100 mg capsule, Disp: , Rfl:  .  glimepiride (AMARYL) 2 MG tablet, Take 2 mg by mouth  daily., Disp: , Rfl:  .  glucose blood (BAYER CONTOUR TEST) test strip, , Disp: , Rfl:  .  levothyroxine (SYNTHROID, LEVOTHROID) 150 MCG tablet, , Disp: , Rfl:  .  lovastatin (MEVACOR) 20 MG tablet, lovastatin 20 mg tablet, Disp: , Rfl:  .  meloxicam (MOBIC) 7.5 MG tablet, TAKE ONE TABLET BY MOUTH EVERY DAY, Disp: 60 tablet, Rfl: 2 .  metFORMIN (GLUCOPHAGE-XR) 500 MG 24 hr tablet, TAKE ONE TABLET BY MOUTH 3 TIMES DAILY, Disp: 90 tablet, Rfl: 3 .  metoprolol succinate (TOPROL-XL) 50 MG 24 hr tablet, metoprolol succinate ER 50 mg tablet,extended release 24 hr, Disp: , Rfl:  .  nystatin cream (MYCOSTATIN), APPLY TO AFFECTED AREAS 3 TIMES DAILY AS NEEDED, Disp: , Rfl:  .  ondansetron (ZOFRAN) 4 MG tablet, Take 1 tablet (4 mg total) by mouth daily as needed for nausea or vomiting., Disp: 20 tablet, Rfl: 1  Current Facility-Administered Medications:  .  betamethasone acetate-betamethasone sodium phosphate (CELESTONE) injection 3 mg, 3 mg, Intramuscular, Once, Evans, Brent M, DPM .  betamethasone acetate-betamethasone sodium phosphate (CELESTONE) injection 3 mg, 3 mg, Intramuscular, Once, Edrick Kins, DPM   Allergies  Allergen Reactions  . Enalapril Anaphylaxis  . Sulfur Anaphylaxis  . Lisinopril   . Sulfa Antibiotics     ROS Review of Systems  Constitutional: Negative.   HENT: Negative.   Eyes: Negative.   Respiratory: Negative.   Cardiovascular: Negative.   Gastrointestinal: Negative.   Endocrine: Negative.   Genitourinary: Negative.   Musculoskeletal:       Reports right knee pain that occasionally radiates down right leg. Denies posterior knee pain.  Skin: Negative.   Allergic/Immunologic: Negative.   Neurological: Negative.   Hematological: Negative.   Psychiatric/Behavioral: Negative.   All other systems reviewed and are negative.    OBJECTIVE:    Physical Exam Vitals reviewed.  Constitutional:      Appearance: Normal appearance.  HENT:     Mouth/Throat:     Mouth:  Mucous membranes are moist.  Eyes:     Pupils: Pupils are equal, round, and reactive to light.  Neck:     Vascular: No carotid bruit.     Comments: No goiter Cardiovascular:     Rate and Rhythm: Normal rate and regular rhythm.     Pulses: Normal pulses.     Heart sounds: Normal heart sounds.  Pulmonary:     Effort: Pulmonary effort is normal.     Breath sounds: Normal breath sounds.  Abdominal:     General: Bowel sounds are normal.     Palpations: Abdomen is soft. There is no hepatomegaly, splenomegaly or mass.     Tenderness: There is no abdominal tenderness.  Hernia: A hernia is present.  Musculoskeletal:     Cervical back: Neck supple.     Left lower leg: No edema.     Comments: Pain with range of motion of right knee  Skin:    Findings: No rash.     Comments: Scar on posterior aspect of left foot  Neurological:     Mental Status: She is alert and oriented to person, place, and time.     Motor: No weakness.  Psychiatric:        Mood and Affect: Mood and affect normal.        Behavior: Behavior normal.     BP (!) 134/79   Pulse 77   Ht '5\' 6"'  (1.676 m)   Wt (!) 233 lb 6.4 oz (105.9 kg)   BMI 37.67 kg/m  Wt Readings from Last 3 Encounters:  09/12/19 (!) 233 lb 6.4 oz (105.9 kg)  02/07/19 220 lb (99.8 kg)  12/24/18 242 lb 9.6 oz (110 kg)    Health Maintenance Due  Topic Date Due  . HEMOGLOBIN A1C  Never done  . Hepatitis C Screening  Never done  . PNEUMOCOCCAL POLYSACCHARIDE VACCINE AGE 44-64 HIGH RISK  Never done  . FOOT EXAM  Never done  . OPHTHALMOLOGY EXAM  Never done  . URINE MICROALBUMIN  Never done  . COVID-19 Vaccine (1) Never done  . HIV Screening  Never done  . TETANUS/TDAP  Never done  . PAP SMEAR-Modifier  Never done    There are no preventive care reminders to display for this patient.  CBC Latest Ref Rng & Units 10/26/2014 09/28/2014 10/26/2013  WBC 3.6 - 11.0 K/uL 6.1 6.1 12.0(H)  Hemoglobin 12.0 - 16.0 g/dL 13.5 14.2 14.8  Hematocrit 35 - 47  % 39.8 41.7 44.4  Platelets 150 - 440 K/uL 224 232 269   CMP Latest Ref Rng & Units 10/26/2014 09/28/2014 10/26/2013  Glucose 65 - 99 mg/dL 150(H) 138(H) 202(H)  BUN 6 - 20 mg/dL '12 8 14  ' Creatinine 0.44 - 1.00 mg/dL 0.70 0.61 0.84  Sodium 135 - 145 mmol/L 140 136 138  Potassium 3.5 - 5.1 mmol/L 3.4(L) 3.5 3.7  Chloride 101 - 111 mmol/L 103 100(L) 101  CO2 22 - 32 mmol/L '28 28 29  ' Calcium 8.9 - 10.3 mg/dL 9.4 9.1 8.8  Total Protein 6.5 - 8.1 g/dL 6.9 8.0 -  Total Bilirubin 0.3 - 1.2 mg/dL 0.8 0.7 -  Alkaline Phos 38 - 126 U/L 48 54 -  AST 15 - 41 U/L 41 31 -  ALT 14 - 54 U/L 28 29 -    No results found for: TSH Lab Results  Component Value Date   ALBUMIN 4.0 10/26/2014   ANIONGAP 9 10/26/2014   No results found for: CHOL, HDL, LDLCALC, CHOLHDL No results found for: TRIG No results found for: HGBA1C    ASSESSMENT & PLAN:   Problem List Items Addressed This Visit      Cardiovascular and Mediastinum   Hypertension    - Today, the patient's blood pressure is well managed on amlodapine. - The patient will continue the current treatment regimen.  - I encouraged the patient to eat a low-sodium diet to help control blood pressure. - I encouraged the patient to live an active lifestyle and complete activities that increases heart rate to 85% target heart rate at least 5 times per week for one hour.          Relevant Orders  COMPLETE METABOLIC PANEL WITH GFR     Endocrine   Diabetes mellitus ([place]) - Primary    - The patient's blood sugar is under control on glipizide. - The patient will continue the current treatment regimen.  - I encouraged the patient to regularly check blood sugar.  - I encouraged the patient to monitor diet. I encouraged the patient to eat low-carb and low-sugar to help prevent blood sugar spikes.  - I encouraged the patient to continue following their prescribed treatment plan for diabetes - I informed the patient to get help if blood sugar drops below  82m/dL, or if suddenly have trouble thinking clearly or breathing.          Relevant Orders   Hemoglobin A1c   Microalbumin, urine     Musculoskeletal and Integument   Primary localized osteoarthrosis of ankle and foot    Ref to podiatrist        Other   Mixed hyperlipidemia    - The patient's hyperlipidemia is stable on mevacor. - The patient will continue the current treatment regimen.  - I encouraged the patient to eat more vegetables and whole wheat, and to avoid fatty foods like whole milk, hard cheese, egg yolks, margarine, baked sweets, and fried foods.  - I encouraged the patient to live an active lifestyle and complete activities for 40 minutes at least three times per week.  - I instructed the patient to go to the ER if they begin having chest pain.       Relevant Orders   Lipid panel    Other Visit Diagnoses    Hx of thyroid cancer       Relevant Orders   CBC with Differential/Platelet   TSH      No orders of the defined types were placed in this encounter.     Follow-up: Return in about 1 week (around 09/19/2019).  covid vaccination  Discussed will draw  hbaic  Dr. JJane CanaryRMedical [facility] [place] [id number]  By signing my name below, I, MClerance Lav attest that this documentation has been prepared under the direction and in the presence of MCletis Athens MD. Electronically Signed: JCletis Athens MD 09/12/19, 7:16 PM   I personally performed the services described in this documentation, which was SCRIBED in my presence. The recorded information has been reviewed and considered accurate. It has been edited as necessary during review. JCletis Athens MD

## 2019-09-12 NOTE — Assessment & Plan Note (Signed)
-   The patient's hyperlipidemia is stable on mevacor. - The patient will continue the current treatment regimen.  - I encouraged the patient to eat more vegetables and whole wheat, and to avoid fatty foods like whole milk, hard cheese, egg yolks, margarine, baked sweets, and fried foods.  - I encouraged the patient to live an active lifestyle and complete activities for 40 minutes at least three times per week.  - I instructed the patient to go to the ER if they begin having chest pain.

## 2019-09-12 NOTE — Assessment & Plan Note (Signed)
-   The patient's blood sugar is under control on glipizide. - The patient will continue the current treatment regimen.  - I encouraged the patient to regularly check blood sugar.  - I encouraged the patient to monitor diet. I encouraged the patient to eat low-carb and low-sugar to help prevent blood sugar spikes.  - I encouraged the patient to continue following their prescribed treatment plan for diabetes - I informed the patient to get help if blood sugar drops below 54mg/dL, or if suddenly have trouble thinking clearly or breathing.     

## 2019-09-13 LAB — LIPID PANEL
Cholesterol: 153 mg/dL (ref <200)
HDL: 45 mg/dL — ABNORMAL LOW (ref >=50)
LDL Cholesterol (Calc): 72 mg/dL (calc)
Non-HDL Cholesterol (Calc): 108 mg/dL (calc) (ref <130)
Total CHOL/HDL Ratio: 3.4 (calc) (ref <5.0)
Triglycerides: 273 mg/dL — ABNORMAL HIGH (ref <150)

## 2019-09-13 LAB — COMPLETE METABOLIC PANEL WITH GFR
AG Ratio: 1.6 (calc) (ref 1.0–2.5)
ALT: 28 U/L (ref 6–29)
AST: 29 U/L (ref 10–35)
Albumin: 4.5 g/dL (ref 3.6–5.1)
Alkaline phosphatase (APISO): 43 U/L (ref 37–153)
BUN: 13 mg/dL (ref 7–25)
CO2: 27 mmol/L (ref 20–32)
Calcium: 9.3 mg/dL (ref 8.6–10.4)
Chloride: 101 mmol/L (ref 98–110)
Creat: 0.62 mg/dL (ref 0.50–0.99)
GFR, Est African American: 112 mL/min/{1.73_m2} (ref >=60)
GFR, Est Non African American: 97 mL/min/{1.73_m2} (ref >=60)
Globulin: 2.8 g/dL (calc) (ref 1.9–3.7)
Glucose, Bld: 149 mg/dL — ABNORMAL HIGH (ref 65–99)
Potassium: 4.1 mmol/L (ref 3.5–5.3)
Sodium: 140 mmol/L (ref 135–146)
Total Bilirubin: 0.8 mg/dL (ref 0.2–1.2)
Total Protein: 7.3 g/dL (ref 6.1–8.1)

## 2019-09-13 LAB — CBC WITH DIFFERENTIAL/PLATELET
Absolute Monocytes: 400 cells/uL (ref 200–950)
Basophils Absolute: 42 cells/uL (ref 0–200)
Basophils Relative: 0.8 %
Eosinophils Absolute: 239 cells/uL (ref 15–500)
Eosinophils Relative: 4.6 %
HCT: 39.9 % (ref 35.0–45.0)
Hemoglobin: 13.5 g/dL (ref 11.7–15.5)
Lymphs Abs: 1882 cells/uL (ref 850–3900)
MCH: 29.7 pg (ref 27.0–33.0)
MCHC: 33.8 g/dL (ref 32.0–36.0)
MCV: 87.9 fL (ref 80.0–100.0)
MPV: 11.2 fL (ref 7.5–12.5)
Monocytes Relative: 7.7 %
Neutro Abs: 2636 cells/uL (ref 1500–7800)
Neutrophils Relative %: 50.7 %
Platelets: 256 10*3/uL (ref 140–400)
RBC: 4.54 10*6/uL (ref 3.80–5.10)
RDW: 13.2 % (ref 11.0–15.0)
Total Lymphocyte: 36.2 %
WBC: 5.2 10*3/uL (ref 3.8–10.8)

## 2019-09-13 LAB — TSH: TSH: 0.66 mIU/L (ref 0.40–4.50)

## 2019-09-13 LAB — HEMOGLOBIN A1C
Hgb A1c MFr Bld: 7.6 % of total Hgb — ABNORMAL HIGH (ref <5.7)
Mean Plasma Glucose: 171 (calc)
eAG (mmol/L): 9.5 (calc)

## 2019-09-13 LAB — MICROALBUMIN, URINE: Microalb, Ur: 4.8 mg/dL

## 2019-09-19 ENCOUNTER — Ambulatory Visit (INDEPENDENT_AMBULATORY_CARE_PROVIDER_SITE_OTHER): Payer: BC Managed Care – PPO | Admitting: Internal Medicine

## 2019-09-19 ENCOUNTER — Other Ambulatory Visit: Payer: Self-pay

## 2019-09-19 ENCOUNTER — Encounter: Payer: Self-pay | Admitting: Internal Medicine

## 2019-09-19 VITALS — BP 141/77 | HR 78 | Ht 65.0 in | Wt 235.1 lb

## 2019-09-19 DIAGNOSIS — E669 Obesity, unspecified: Secondary | ICD-10-CM

## 2019-09-19 DIAGNOSIS — E1169 Type 2 diabetes mellitus with other specified complication: Secondary | ICD-10-CM

## 2019-09-19 DIAGNOSIS — R252 Cramp and spasm: Secondary | ICD-10-CM

## 2019-09-19 DIAGNOSIS — I10 Essential (primary) hypertension: Secondary | ICD-10-CM

## 2019-09-19 NOTE — Assessment & Plan Note (Signed)
-   I encouraged the patient to lose weight.  - I educated them on making healthy dietary choices including eating more fruits and vegetables and less fried foods. - I encouraged the patient to exercise more, and educated on the benefits of exercise including weight loss, diabetes management, and hypertension management.  -Pt is using keto tablets to lose weight and water aerobics.

## 2019-09-19 NOTE — Assessment & Plan Note (Signed)
Pt reports cramps on the right thigh on the middle area. She is not tender, no hematoma or bruises, no tenderness in the leg, movement of the hip, knee and ankle joint is full, peripheral pulses are  bilaterally palpable. Pt got her johnson and johnson COVID19 vaccine yesterday.

## 2019-09-19 NOTE — Progress Notes (Signed)
° °Established Patient Office Visit ° °SUBJECTIVE:  °Subjective  °Patient ID: Jill Holmes, female    DOB: 08/22/1955  Age: 64 y.o. MRN: [id number] ° °CC:  °Chief Complaint  °Patient presents with  °• lab results  ° ° °HPI °Jill Holmes is a 64 y.o. female presenting today for going over labs and she woke up with pain in her right groin. She was barely able to put weight on her legs. Pt got her COVID19 vaccine yesterday. She reports she also has a small hernia. Pt's blood sugar has been well and she is taking Metformin ER. She is also trying to lose weight. Pt will be starting water aerobics and taking Keto pills to assist with loosing weight. Pt's daughter and her have been walking to help stay active as well. She try's to eat foods that do not raise blood sugar. She has an appointment with the eye doctor in September.   ° °Past Medical History:  °Diagnosis Date  °• Cancer ([facility])   ° thyroid ca  °• Diabetes mellitus without complication ([facility])   °• Diverticulitis   °• Hypertension   °• Thyroid disease   ° ° °Past Surgical History:  °Procedure Laterality Date  °• ABDOMINAL HYSTERECTOMY    °• COLONOSCOPY WITH PROPOFOL N/A 02/07/2019  ° Procedure: COLONOSCOPY WITH PROPOFOL;  Surgeon: Wohl, Darren, MD;  Location: [facility] ENDOSCOPY;  Service: Endoscopy;  Laterality: N/A;  ° ° °Family History  °Problem Relation Age of Onset  °• Breast cancer Mother 70  ° ° °Social History  ° °Socioeconomic History  °• Marital status: Married  °  Spouse name: Not on file  °• Number of children: Not on file  °• Years of education: Not on file  °• Highest education level: Not on file  °Occupational History  °• Not on file  °Tobacco Use  °• Smoking status: Never Smoker  °• Smokeless tobacco: Never Used  °Substance and Sexual Activity  °• Alcohol use: No  °• Drug use: No  °• Sexual activity: Yes  °  Birth control/protection: None  °Other Topics Concern  °• Not on file  °Social History Narrative  °• Not on file  ° °Social Determinants of Health    ° °Financial Resource Strain:   °• Difficulty of Paying Living Expenses:   °Food Insecurity:   °• Worried About Running Out of Food in the Last Year:   °• Ran Out of Food in the Last Year:   °Transportation Needs:   °• Lack of Transportation (Medical):   °• Lack of Transportation (Non-Medical):   °Physical Activity:   °• Days of Exercise per Week:   °• Minutes of Exercise per Session:   °Stress:   °• Feeling of Stress :   °Social Connections:   °• Frequency of Communication with Friends and Family:   °• Frequency of Social Gatherings with Friends and Family:   °• Attends Religious Services:   °• Active Member of Clubs or Organizations:   °• Attends Club or Organization Meetings:   °• Marital Status:   °Intimate Partner Violence:   °• Fear of Current or Ex-Partner:   °• Emotionally Abused:   °• Physically Abused:   °• Sexually Abused:   ° ° ° °Current Outpatient Medications:  °•  amLODipine (NORVASC) 10 MG tablet, TAKE ONE TABLET EVERY DAY, Disp: 30 tablet, Rfl: 6 °•  Blood Glucose Monitoring Suppl (ONETOUCH VERIO FLEX SYSTEM) w/Device KIT, See admin instructions., Disp: , Rfl:  °•  clotrimazole-betamethasone (LOTRISONE)   LOTRISONE) cream, Apply 1 application topically 2 (two) times daily., Disp: , Rfl:    EPINEPHrine 0.3 mg/0.3 mL IJ SOAJ injection, Inject 0.3 mg into the skin., Disp: , Rfl:    gabapentin (NEURONTIN) 100 MG capsule, gabapentin 100 mg capsule, Disp: , Rfl:    glimepiride (AMARYL) 2 MG tablet, Take 2 mg by mouth daily., Disp: , Rfl:    glucose blood (BAYER CONTOUR TEST) test strip, , Disp: , Rfl:    levothyroxine (SYNTHROID, LEVOTHROID) 150 MCG tablet, , Disp: , Rfl:    lovastatin (MEVACOR) 20 MG tablet, lovastatin 20 mg tablet, Disp: , Rfl:    meloxicam (MOBIC) 7.5 MG tablet, TAKE ONE TABLET BY MOUTH EVERY DAY, Disp: 60 tablet, Rfl: 2   metFORMIN (GLUCOPHAGE-XR) 500 MG 24 hr tablet, TAKE ONE TABLET BY MOUTH 3 TIMES DAILY, Disp: 90 tablet, Rfl: 3   metoprolol succinate (TOPROL-XL) 50 MG 24 hr  tablet, metoprolol succinate ER 50 mg tablet,extended release 24 hr, Disp: , Rfl:    nystatin cream (MYCOSTATIN), APPLY TO AFFECTED AREAS 3 TIMES DAILY AS NEEDED, Disp: , Rfl:    ondansetron (ZOFRAN) 4 MG tablet, Take 1 tablet (4 mg total) by mouth daily as needed for nausea or vomiting., Disp: 20 tablet, Rfl: 1  Current Facility-Administered Medications:    betamethasone acetate-betamethasone sodium phosphate (CELESTONE) injection 3 mg, 3 mg, Intramuscular, Once, Evans, Brent M, DPM   betamethasone acetate-betamethasone sodium phosphate (CELESTONE) injection 3 mg, 3 mg, Intramuscular, Once, Evans, Brent M, DPM   Allergies  Allergen Reactions   Enalapril Anaphylaxis   Sulfur Anaphylaxis   Lisinopril    Sulfa Antibiotics     ROS Review of Systems  Constitutional: Negative.   HENT: Negative.   Eyes: Negative.   Respiratory: Negative.   Cardiovascular: Negative.   Gastrointestinal: Negative.   Endocrine: Negative.   Genitourinary: Negative.   Musculoskeletal: Negative.        Right groin pain  Skin: Negative.   Allergic/Immunologic: Negative.   Neurological: Negative.   Hematological: Negative.   Psychiatric/Behavioral: Negative.   All other systems reviewed and are negative.    OBJECTIVE:    Physical Exam Vitals reviewed.  Constitutional:      Appearance: Normal appearance.  HENT:     Mouth/Throat:     Mouth: Mucous membranes are moist.  Eyes:     Pupils: Pupils are equal, round, and reactive to light.  Neck:     Vascular: No carotid bruit.  Cardiovascular:     Rate and Rhythm: Normal rate and regular rhythm.     Pulses: Normal pulses.     Heart sounds: Normal heart sounds.     Comments: Hypertension Pulmonary:     Effort: Pulmonary effort is normal.     Breath sounds: Normal breath sounds.  Abdominal:     General: Bowel sounds are normal.     Palpations: Abdomen is soft. There is no hepatomegaly, splenomegaly or mass.     Tenderness: There is no  abdominal tenderness.     Hernia: No hernia is present.  Musculoskeletal:        General: No tenderness.     Cervical back: Neck supple.     Right lower leg: No edema.     Left lower leg: No edema.  Skin:    Findings: No rash.  Neurological:     Mental Status: She is alert and oriented to person, place, and time.     Motor: No weakness.  Psychiatric:  Mood and Affect: Mood and affect normal.     °   Behavior: Behavior normal.  ° ° ° °BP (!) 141/77    Pulse 78    Ht 5' 5" (1.651 m)    Wt (!) 235 lb 1.6 oz (106.6 kg)    BMI 39.12 kg/m²  °Wt Readings from Last 3 Encounters:  °09/19/19 (!) 235 lb 1.6 oz (106.6 kg)  °09/12/19 (!) 233 lb 6.4 oz (105.9 kg)  °02/07/19 220 lb (99.8 kg)  ° ° °Health Maintenance Due  °Topic Date Due  °• Hepatitis C Screening  Never done  °• PNEUMOCOCCAL POLYSACCHARIDE VACCINE AGE 2-64 HIGH RISK  Never done  °• FOOT EXAM  Never done  °• OPHTHALMOLOGY EXAM  Never done  °• COVID-19 Vaccine (1) Never done  °• HIV Screening  Never done  °• TETANUS/TDAP  Never done  °• PAP SMEAR-Modifier  Never done  ° ° °There are no preventive care reminders to display for this patient. ° °CBC Latest Ref Rng & Units 09/12/2019 10/26/2014 09/28/2014  °WBC 3.8 - 10.8 Thousand/uL 5.2 6.1 6.1  °Hemoglobin 11.7 - 15.5 g/dL 13.5 13.5 14.2  °Hematocrit 35 - 45 % 39.9 39.8 41.7  °Platelets 140 - 400 Thousand/uL 256 224 232  ° °CMP Latest Ref Rng & Units 09/12/2019 10/26/2014 09/28/2014  °Glucose 65 - 99 mg/dL 149(H) 150(H) 138(H)  °BUN 7 - 25 mg/dL 13 12 8  °Creatinine 0.50 - 0.99 mg/dL 0.62 0.70 0.61  °Sodium 135 - 146 mmol/L 140 140 136  °Potassium 3.5 - 5.3 mmol/L 4.1 3.4(L) 3.5  °Chloride 98 - 110 mmol/L 101 103 100(L)  °CO2 20 - 32 mmol/L 27 28 28  °Calcium 8.6 - 10.4 mg/dL 9.3 9.4 9.1  °Total Protein 6.1 - 8.1 g/dL 7.3 6.9 8.0  °Total Bilirubin 0.2 - 1.2 mg/dL 0.8 0.8 0.7  °Alkaline Phos 38 - 126 U/L - 48 54  °AST 10 - 35 U/L 29 41 31  °ALT 6 - 29 U/L 28 28 29  ° ° °Lab Results  °Component Value Date  ° TSH  0.66 09/12/2019  ° °Lab Results  °Component Value Date  ° ALBUMIN 4.0 10/26/2014  ° ANIONGAP 9 10/26/2014  ° °Lab Results  °Component Value Date  ° CHOL 153 09/12/2019  ° HDL 45 (L) 09/12/2019  ° LDLCALC 72 09/12/2019  ° CHOLHDL 3.4 09/12/2019  ° °Lab Results  °Component Value Date  ° TRIG 273 (H) 09/12/2019  ° °Lab Results  °Component Value Date  ° HGBA1C 7.6 (H) 09/12/2019  ° ° °  °ASSESSMENT & PLAN:  ° °Problem List Items Addressed This Visit   °  ° Cardiovascular and Mediastinum  ° Hypertension  °  - Today, the patient's blood pressure is well managed on amlopipine, metoprolol. °- The patient will continue the current treatment regimen.  °- I encouraged the patient to eat a low-sodium diet to help control blood pressure. °- I encouraged the patient to live an active lifestyle and complete activities that increases heart rate to 85% target heart rate at least 5 times per week for one hour.  ° ° ° °  °  °  ° Endocrine  ° Diabetes mellitus ([facility]) - Primary  °  - The patient's blood sugar is under control on metformin. °- The patient will continue the current treatment regimen.  °- I encouraged the patient to regularly check blood sugar.  °- I encouraged the patient to monitor diet. I encouraged the patient   to eat low-carb and low-sugar to help prevent blood sugar spikes.  - I encouraged the patient to continue following their prescribed treatment plan for diabetes - I informed the patient to get help if blood sugar drops below 11m/dL, or if suddenly have trouble thinking clearly or breathing. -Pt is following diet and trying to lose weight.            Other   Obesity (BMI 30-39.9)    - I encouraged the patient to lose weight.  - I educated them on making healthy dietary choices including eating more fruits and vegetables and less fried foods. - I encouraged the patient to exercise more, and educated on the benefits of exercise including weight loss, diabetes management, and hypertension management.    -Pt is using keto tablets to lose weight and water aerobics.        Nocturnal muscle cramps    Pt reports cramps on the right thigh on the middle area. She is not tender, no hematoma or bruises, no tenderness in the leg, movement of the hip, knee and ankle joint is full, peripheral pulses are  bilaterally palpable. Pt got her johnson and johnson COVID19 vaccine yesterday.           No orders of the defined types were placed in this encounter.    Follow-up: Return in about 4 weeks (around 10/17/2019).    Dr. JJane CanaryRChristus Cabrini Surgery Center LLC[address] [place] [id number]  By signing my name below, I, EDawayne Cirri attest that this documentation has been prepared under the direction and in the presence of MCletis Athens MD. Electronically Signed: JCletis Athens MD 09/19/19, 9:11 AM   I personally performed the services described in this documentation, which was SCRIBED in my presence. The recorded information has been reviewed and considered accurate. It has been edited as necessary during review. JCletis Athens MD

## 2019-09-19 NOTE — Assessment & Plan Note (Signed)
-   Today, the patient's blood pressure is well managed on amlopipine, metoprolol. - The patient will continue the current treatment regimen.  - I encouraged the patient to eat a low-sodium diet to help control blood pressure. - I encouraged the patient to live an active lifestyle and complete activities that increases heart rate to 85% target heart rate at least 5 times per week for one hour.

## 2019-09-19 NOTE — Assessment & Plan Note (Signed)
-   The patient's blood sugar is under control on metformin. - The patient will continue the current treatment regimen.  - I encouraged the patient to regularly check blood sugar.  - I encouraged the patient to monitor diet. I encouraged the patient to eat low-carb and low-sugar to help prevent blood sugar spikes.  - I encouraged the patient to continue following their prescribed treatment plan for diabetes - I informed the patient to get help if blood sugar drops below 54mg /dL, or if suddenly have trouble thinking clearly or breathing. -Pt is following diet and trying to lose weight.

## 2019-10-20 ENCOUNTER — Ambulatory Visit: Payer: BC Managed Care – PPO | Admitting: Internal Medicine

## 2019-10-29 ENCOUNTER — Other Ambulatory Visit: Payer: Self-pay | Admitting: Internal Medicine

## 2019-12-11 ENCOUNTER — Other Ambulatory Visit: Payer: Self-pay | Admitting: Podiatry

## 2019-12-16 ENCOUNTER — Other Ambulatory Visit: Payer: Self-pay

## 2019-12-16 ENCOUNTER — Ambulatory Visit (INDEPENDENT_AMBULATORY_CARE_PROVIDER_SITE_OTHER): Payer: BC Managed Care – PPO | Admitting: Podiatry

## 2019-12-16 DIAGNOSIS — M79672 Pain in left foot: Secondary | ICD-10-CM

## 2019-12-16 DIAGNOSIS — M19071 Primary osteoarthritis, right ankle and foot: Secondary | ICD-10-CM

## 2019-12-16 DIAGNOSIS — M19072 Primary osteoarthritis, left ankle and foot: Secondary | ICD-10-CM | POA: Diagnosis not present

## 2019-12-16 DIAGNOSIS — M778 Other enthesopathies, not elsewhere classified: Secondary | ICD-10-CM

## 2019-12-16 DIAGNOSIS — M79671 Pain in right foot: Secondary | ICD-10-CM

## 2019-12-16 NOTE — Progress Notes (Signed)
   HPI: 64 year old female presents to the office today for follow-up evaluation of bilateral foot pain.  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  She states that the injections helped significantly with pain.  Past Medical History:  Diagnosis Date  . Cancer ([place])    thyroid ca  . Diabetes mellitus without complication ([place])   . Diverticulitis   . Hypertension   . Thyroid disease       Physical Exam: General: The patient is alert and oriented x3 in no acute distress.  Dermatology: Skin is warm, dry and supple bilateral lower extremities. Negative for open lesions or macerations.  Vascular: Neurovascular status intact. Palpable pedal pulses bilaterally. No edema or erythema noted. Capillary refill within normal limits.  Neurological: Epicritic and protective threshold grossly intact bilaterally.   Musculoskeletal Exam: Pain on palpation and range of motion to the second MTPJ right foot. Pain on palpation also noted to the midfoot of the right lower extremity. Range of motion within normal limits to all pedal and ankle joints bilateral. Muscle strength 5/5 in all groups bilateral.    Assessment: 1. Second MPJ capsulitis right foot 2. Midfoot capsulitis right foot  Plan of Care:  1. Patient was evaluated.  2. Injection of 0.5 mL Celestone Soluspan injected into the Lisfranc joint/mid bilateral 3.  Refill prescription for meloxicam 7.5 mg daily as needed.  4. Continue taking Gabapentin as directed by PCP.  5.  Appointment with Pedorthist for custom molded orthotics. 6. Return to clinic as needed.   Systems analyst at [employer].    Edrick Kins, DPM [facility]  Dr. Edrick Kins, DPM    [address], [place] [address]                Office  [phone]  Fax [phone]

## 2020-01-14 ENCOUNTER — Other Ambulatory Visit: Payer: Self-pay | Admitting: Podiatry

## 2020-01-14 ENCOUNTER — Other Ambulatory Visit: Payer: Self-pay | Admitting: Internal Medicine

## 2020-01-21 ENCOUNTER — Ambulatory Visit (INDEPENDENT_AMBULATORY_CARE_PROVIDER_SITE_OTHER): Payer: BC Managed Care – PPO | Admitting: Orthotics

## 2020-01-21 ENCOUNTER — Other Ambulatory Visit: Payer: Self-pay

## 2020-01-21 DIAGNOSIS — M778 Other enthesopathies, not elsewhere classified: Secondary | ICD-10-CM | POA: Diagnosis not present

## 2020-01-21 NOTE — Progress Notes (Signed)
Getting repeat of previous order but new scan

## 2020-01-22 ENCOUNTER — Other Ambulatory Visit: Payer: Self-pay | Admitting: Internal Medicine

## 2020-01-26 ENCOUNTER — Other Ambulatory Visit: Payer: Self-pay | Admitting: Internal Medicine

## 2020-01-30 ENCOUNTER — Other Ambulatory Visit: Payer: Self-pay

## 2020-01-30 ENCOUNTER — Ambulatory Visit (INDEPENDENT_AMBULATORY_CARE_PROVIDER_SITE_OTHER): Payer: BC Managed Care – PPO | Admitting: Family Medicine

## 2020-01-30 ENCOUNTER — Encounter: Payer: Self-pay | Admitting: Family Medicine

## 2020-01-30 VITALS — BP 123/67 | HR 73 | Ht 67.0 in | Wt 233.0 lb

## 2020-01-30 DIAGNOSIS — E1169 Type 2 diabetes mellitus with other specified complication: Secondary | ICD-10-CM | POA: Diagnosis not present

## 2020-01-30 DIAGNOSIS — R140 Abdominal distension (gaseous): Secondary | ICD-10-CM | POA: Diagnosis not present

## 2020-01-30 LAB — GLUCOSE, POCT (MANUAL RESULT ENTRY): POC Glucose: 120 mg/dl — AB (ref 70–99)

## 2020-01-30 MED ORDER — POLYETHYLENE GLYCOL 3350 17 GM/SCOOP PO POWD: 17.0000 g | Freq: Two times a day (BID) | ORAL | 1 refills | Status: AC | PRN

## 2020-01-30 MED ORDER — DOCUSATE SODIUM 100 MG PO CAPS: 100.0000 mg | ORAL_CAPSULE | Freq: Two times a day (BID) | ORAL | 0 refills | Status: AC

## 2020-01-30 NOTE — Assessment & Plan Note (Signed)
Diabetes mellitus Type II, under fair control.. Discussed general issues about diabetes pathophysiology and management.

## 2020-01-30 NOTE — Assessment & Plan Note (Signed)
Pt with bowel movement every 2-3 days, abd pain and distension mid abdoment. Plan- Starting Miralax and Docuset.

## 2020-01-30 NOTE — Progress Notes (Signed)
Established Patient Office Visit  SUBJECTIVE:  Subjective  Patient ID: Jill Holmes, female    DOB: 02-14-1956  Age: 64 y.o. MRN: [id number]  CC:  Chief Complaint  Patient presents with  . Abdominal Pain    Patient states she is having abdominal pain on the right side that comes and goes. Patient states she takes Alka-Seltzer and the pain goes away.    HPI Jill Holmes is a 64 y.o. female presenting today for     Past Medical History:  Diagnosis Date  . Cancer ([place])    thyroid ca  . Diabetes mellitus without complication ([place])   . Diverticulitis   . Hypertension   . Thyroid disease     Past Surgical History:  Procedure Laterality Date  . ABDOMINAL HYSTERECTOMY    . COLONOSCOPY WITH PROPOFOL N/A 02/07/2019   Procedure: COLONOSCOPY WITH PROPOFOL;  Surgeon: Lucilla Lame, MD;  Location: [facility];  Service: Endoscopy;  Laterality: N/A;    Family History  Problem Relation Age of Onset  . Breast cancer Mother 27    Social History   Socioeconomic History  . Marital status: Married    Spouse name: Not on file  . Number of children: Not on file  . Years of education: Not on file  . Highest education level: Not on file  Occupational History  . Not on file  Tobacco Use  . Smoking status: Never Smoker  . Smokeless tobacco: Never Used  Substance and Sexual Activity  . Alcohol use: No  . Drug use: No  . Sexual activity: Yes    Birth control/protection: None  Other Topics Concern  . Not on file  Social History Narrative  . Not on file   Social Determinants of Health   Financial Resource Strain: Not on file  Food Insecurity: Not on file  Transportation Needs: Not on file  Physical Activity: Not on file  Stress: Not on file  Social Connections: Not on file  Intimate Partner Violence: Not on file     Current Outpatient Medications:  .  amLODipine (NORVASC) 10 MG tablet, TAKE ONE TABLET EVERY DAY, Disp: 30 tablet, Rfl: 6 .  Blood Glucose Monitoring  Suppl ([place]) w/Device KIT, See admin instructions., Disp: , Rfl:  .  clotrimazole-betamethasone (LOTRISONE) cream, Apply 1 application topically 2 (two) times daily., Disp: , Rfl:  .  docusate sodium (COLACE) 100 MG capsule, Take 1 capsule (100 mg total) by mouth 2 (two) times daily., Disp: 10 capsule, Rfl: 0 .  EPINEPHrine 0.3 mg/0.3 mL IJ SOAJ injection, Inject 0.3 mg into the skin., Disp: , Rfl:  .  gabapentin (NEURONTIN) 100 MG capsule, TAKE 2 CAPSULES BY MOUTH 3 TIMES DAILY, Disp: 540 capsule, Rfl: 1 .  glimepiride (AMARYL) 2 MG tablet, TAKE 2 TABLETS BY MOUTH AT NIGHTTIME, Disp: 60 tablet, Rfl: 6 .  glucose blood (BAYER CONTOUR TEST) test strip, , Disp: , Rfl:  .  levothyroxine (SYNTHROID) 150 MCG tablet, TAKE 1 TABLET BY MOUTH DAILY, Disp: 90 tablet, Rfl: 3 .  lovastatin (MEVACOR) 20 MG tablet, lovastatin 20 mg tablet, Disp: , Rfl:  .  meloxicam (MOBIC) 7.5 MG tablet, TAKE 1 TABLET BY MOUTH DAILY, Disp: 30 tablet, Rfl: 1 .  metFORMIN (GLUCOPHAGE-XR) 500 MG 24 hr tablet, TAKE ONE TABLET BY MOUTH 3 TIMES DAILY, Disp: 90 tablet, Rfl: 3 .  metoprolol succinate (TOPROL-XL) 50 MG 24 hr tablet, metoprolol succinate ER 50 mg tablet,extended release 24 hr, Disp: ,  Rfl:  .  nystatin cream (MYCOSTATIN), APPLY TO AFFECTED AREAS 3 TIMES DAILY AS NEEDED, Disp: , Rfl:  .  ondansetron (ZOFRAN) 4 MG tablet, Take 1 tablet (4 mg total) by mouth daily as needed for nausea or vomiting., Disp: 20 tablet, Rfl: 1 .  polyethylene glycol powder (GLYCOLAX/MIRALAX) 17 GM/SCOOP powder, Take 17 g by mouth 2 (two) times daily as needed., Disp: 3350 g, Rfl: 1  Current Facility-Administered Medications:  .  betamethasone acetate-betamethasone sodium phosphate (CELESTONE) injection 3 mg, 3 mg, Intramuscular, Once, Evans, Brent M, DPM .  betamethasone acetate-betamethasone sodium phosphate (CELESTONE) injection 3 mg, 3 mg, Intramuscular, Once, Edrick Kins, DPM   Allergies  Allergen Reactions  .  Enalapril Anaphylaxis  . Sulfur Anaphylaxis  . Lisinopril   . Sulfa Antibiotics     ROS Review of Systems  Constitutional: Negative.   HENT: Negative.   Respiratory: Negative.   Cardiovascular: Negative.   Gastrointestinal: Positive for abdominal distention, abdominal pain and constipation.  Hematological: Negative.   Psychiatric/Behavioral: Negative.      OBJECTIVE:    Physical Exam Constitutional:      Appearance: She is well-developed.  HENT:     Head: Normocephalic.     Mouth/Throat:     Mouth: Mucous membranes are moist.  Cardiovascular:     Rate and Rhythm: Normal rate and regular rhythm.  Pulmonary:     Effort: Pulmonary effort is normal.     Breath sounds: Normal breath sounds.  Abdominal:     General: Bowel sounds are normal.  Genitourinary:    Adnexa: Right adnexa normal and left adnexa normal.  Skin:    General: Skin is warm.  Neurological:     Mental Status: She is alert.  Psychiatric:        Mood and Affect: Mood normal.     BP 123/67   Pulse 73   Ht _0  (1.702 m)   Wt 233 lb (105.7 kg)   BMI 36.49 kg/m  Wt Readings from Last 3 Encounters:  01/30/20 233 lb (105.7 kg)  09/19/19 (!) 235 lb 1.6 oz (106.6 kg)  09/12/19 (!) 233 lb 6.4 oz (105.9 kg)    Health Maintenance Due  Topic Date Due  . Hepatitis C Screening  Never done  . PNEUMOCOCCAL POLYSACCHARIDE VACCINE AGE 80-64 HIGH RISK  Never done  . FOOT EXAM  Never done  . OPHTHALMOLOGY EXAM  Never done  . COVID-19 Vaccine (1) Never done  . HIV Screening  Never done  . TETANUS/TDAP  Never done  . PAP SMEAR-Modifier  Never done  . INFLUENZA VACCINE  09/21/2019    There are no preventive care reminders to display for this patient.  CBC Latest Ref Rng & Units 09/12/2019 10/26/2014 09/28/2014  WBC 3.8 - 10.8 Thousand/uL 5.2 6.1 6.1  Hemoglobin 11.7 - 15.5 g/dL 13.5 13.5 14.2  Hematocrit 35.0 - 45.0 % 39.9 39.8 41.7  Platelets 140 - 400 Thousand/uL 256 224 232   CMP Latest Ref Rng & Units  09/12/2019 10/26/2014 09/28/2014  Glucose 65 - 99 mg/dL 149(H) 150(H) 138(H)  BUN 7 - 25 mg/dL _1 Creatinine 0.50 - 0.99 mg/dL 0.62 0.70 0.61  Sodium 135 - 146 mmol/L 140 140 136  Potassium 3.5 - 5.3 mmol/L 4.1 3.4(L) 3.5  Chloride 98 - 110 mmol/L 101 103 100(L)  CO2 20 - 32 mmol/L _2 Calcium 8.6 - 10.4 mg/dL 9.3 9.4 9.1  Total Protein 6.1 - 8.1 g/dL  7.3 6.9 8.0  Total Bilirubin 0.2 - 1.2 mg/dL 0.8 0.8 0.7  Alkaline Phos 38 - 126 U/L - 48 54  AST 10 - 35 U/L 29 41 31  ALT 6 - 29 U/L _0 Lab Results  Component Value Date   TSH 0.66 09/12/2019   Lab Results  Component Value Date   ALBUMIN 4.0 10/26/2014   ANIONGAP 9 10/26/2014   Lab Results  Component Value Date   CHOL 153 09/12/2019   HDL 45 (L) 09/12/2019   LDLCALC 72 09/12/2019   CHOLHDL 3.4 09/12/2019   Lab Results  Component Value Date   TRIG 273 (H) 09/12/2019   Lab Results  Component Value Date   HGBA1C 7.6 (H) 09/12/2019      ASSESSMENT & PLAN:   Problem List Items Addressed This Visit      Endocrine   Diabetes mellitus ([place]) - Primary    Diabetes mellitus Type II, under fair control.. Discussed general issues about diabetes pathophysiology and management.       Relevant Orders   POCT glucose (manual entry) (Completed)     Other   Abdominal distension (gaseous)    Pt with bowel movement every 2-3 days, abd pain and distension mid abdoment. Plan- Starting Miralax and Docuset.          Meds ordered this encounter  Medications  . polyethylene glycol powder (GLYCOLAX/MIRALAX) 17 GM/SCOOP powder    Sig: Take 17 g by mouth 2 (two) times daily as needed.    Dispense:  3350 g    Refill:  1  . docusate sodium (COLACE) 100 MG capsule    Sig: Take 1 capsule (100 mg total) by mouth 2 (two) times daily.    Dispense:  10 capsule    Refill:  0      Follow-up: No follow-ups on file.    Beckie Salts, [facility] [address] [place], [place] [address]

## 2020-02-11 ENCOUNTER — Other Ambulatory Visit: Payer: Self-pay

## 2020-02-11 ENCOUNTER — Ambulatory Visit: Payer: BC Managed Care – PPO | Admitting: Orthotics

## 2020-02-11 ENCOUNTER — Ambulatory Visit: Payer: Self-pay

## 2020-02-11 DIAGNOSIS — M778 Other enthesopathies, not elsewhere classified: Secondary | ICD-10-CM

## 2020-02-11 NOTE — Progress Notes (Signed)
Patients f/o are not in; will ship today to [facility]; will bring to [place] and patien tmay pick up

## 2020-02-16 ENCOUNTER — Other Ambulatory Visit: Payer: Self-pay | Admitting: Internal Medicine

## 2020-02-18 ENCOUNTER — Telehealth: Payer: Self-pay

## 2020-02-18 NOTE — Telephone Encounter (Signed)
Pt returned call and is aware ok to pick up orthotics in [place] office.

## 2020-02-18 NOTE — Telephone Encounter (Signed)
LVM letting pt know orthotics were in [place] and ready to be picked up.

## 2020-03-01 ENCOUNTER — Other Ambulatory Visit: Payer: Self-pay | Admitting: Internal Medicine

## 2020-03-18 ENCOUNTER — Other Ambulatory Visit: Payer: Self-pay | Admitting: Internal Medicine

## 2020-03-19 ENCOUNTER — Other Ambulatory Visit: Payer: Self-pay | Admitting: Podiatry

## 2020-03-19 NOTE — Telephone Encounter (Signed)
Please Advise

## 2020-03-25 ENCOUNTER — Telehealth: Payer: Self-pay | Admitting: Podiatry

## 2020-03-25 NOTE — Telephone Encounter (Signed)
Patient has requested refill for meloxicam, Please Advise

## 2020-03-26 ENCOUNTER — Telehealth: Payer: Self-pay | Admitting: Podiatry

## 2020-03-26 NOTE — Telephone Encounter (Signed)
Pt left message yesterday afternoon  that the orthotics she got seem to be too thick.   I returned call and left message for pt to call to schedule an appt for Rick in [place] to see if he can shave them down or if they need to be sent back to manufacturer.

## 2020-03-26 NOTE — Telephone Encounter (Signed)
I spoke with pharmacist, informed that per Dr. Amalia Hailey verbal order, refill one time and patient will need to follow up in office since it has been 1 year since last seen.

## 2020-03-30 ENCOUNTER — Telehealth: Payer: Self-pay | Admitting: Podiatry

## 2020-03-30 ENCOUNTER — Other Ambulatory Visit: Payer: Self-pay | Admitting: *Deleted

## 2020-03-30 MED ORDER — CLOTRIMAZOLE-BETAMETHASONE 1-0.05 % EX CREA: 1.0000 "application " | TOPICAL_CREAM | Freq: Two times a day (BID) | CUTANEOUS | 6 refills | Status: DC

## 2020-03-30 NOTE — Telephone Encounter (Signed)
Pt left message asking to talk to me about an appt with Liliane Channel.  I returned call and left message for pt to call me to discuss an appt. She is a [place] pt and next available is not until mid march but I could put her on waitlist if anything cxled before that.

## 2020-04-21 ENCOUNTER — Other Ambulatory Visit: Payer: BC Managed Care – PPO

## 2020-04-28 ENCOUNTER — Other Ambulatory Visit: Payer: Self-pay

## 2020-04-28 ENCOUNTER — Other Ambulatory Visit: Payer: BC Managed Care – PPO

## 2020-04-29 ENCOUNTER — Other Ambulatory Visit: Payer: Self-pay | Admitting: Podiatry

## 2020-04-29 ENCOUNTER — Other Ambulatory Visit: Payer: Self-pay | Admitting: Internal Medicine

## 2020-04-29 DIAGNOSIS — Z1231 Encounter for screening mammogram for malignant neoplasm of breast: Secondary | ICD-10-CM

## 2020-04-29 NOTE — Telephone Encounter (Signed)
Please Advise

## 2020-04-30 ENCOUNTER — Other Ambulatory Visit: Payer: Self-pay | Admitting: Podiatry

## 2020-04-30 ENCOUNTER — Telehealth: Payer: Self-pay | Admitting: Podiatry

## 2020-04-30 MED ORDER — MELOXICAM 7.5 MG PO TABS: 7.5000 mg | ORAL_TABLET | Freq: Every day | ORAL | 1 refills | Status: DC

## 2020-04-30 NOTE — Telephone Encounter (Signed)
Rx sent 

## 2020-04-30 NOTE — Telephone Encounter (Signed)
Patient requested refill on meloxicam, please advise

## 2020-05-04 ENCOUNTER — Ambulatory Visit: Payer: BC Managed Care – PPO | Admitting: Podiatry

## 2020-05-04 ENCOUNTER — Other Ambulatory Visit: Payer: Self-pay

## 2020-05-04 DIAGNOSIS — M778 Other enthesopathies, not elsewhere classified: Secondary | ICD-10-CM | POA: Diagnosis not present

## 2020-05-04 MED ORDER — MELOXICAM 15 MG PO TABS
15.0000 mg | ORAL_TABLET | Freq: Every day | ORAL | 2 refills | Status: DC
Start: 2020-05-04 — End: 2020-11-08

## 2020-05-04 MED ORDER — BETAMETHASONE SOD PHOS & ACET 6 (3-3) MG/ML IJ SUSP: 3.0000 mg | Freq: Once | INTRAMUSCULAR | Status: DC

## 2020-05-04 NOTE — Progress Notes (Signed)
   HPI: 65 year old female presents to the office today for follow-up evaluation of bilateral foot pain.  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  She states that the injections helped significantly with pain.  Past Medical History:  Diagnosis Date  . Cancer ([place])    thyroid ca  . Diabetes mellitus without complication ([place])   . Diverticulitis   . Hypertension   . Thyroid disease       Physical Exam: General: The patient is alert and oriented x3 in no acute distress.  Dermatology: Skin is warm, dry and supple bilateral lower extremities. Negative for open lesions or macerations.  Vascular: Neurovascular status intact. Palpable pedal pulses bilaterally. No edema or erythema noted. Capillary refill within normal limits.  Neurological: Epicritic and protective threshold grossly intact bilaterally.   Musculoskeletal Exam: Pain on palpation and range of motion to the second MTPJ right foot. Pain on palpation also noted to the midfoot of the right lower extremity. Range of motion within normal limits to all pedal and ankle joints bilateral. Muscle strength 5/5 in all groups bilateral.    Assessment: 1. Second MPJ capsulitis right foot 2. Midfoot capsulitis right foot  Plan of Care:  1. Patient was evaluated.  2. Injection of 0.5 mL Celestone Soluspan injected into the Lisfranc joint/mid bilateral 3.  Refill prescription for meloxicam 15 mg daily as needed.  4. Continue taking Gabapentin as directed by PCP.  5.  The patient is very unsatisfied and frustrated with the orthotics that she received most recently.  She has had orthotics in the past which were thinner and provided significant relief.  The most recent orthotics are very thick and very uncomfortable.  She is frustrated with the Pedorthist for not being able to provide the  correct insoles.   6.  Today I am going to put in a fresh new order for orthotics to try and match her old ones.  Order form was filled out and completed to fax to [facility]. 7. return to clinic as needed.   Systems analyst at [employer].    Edrick Kins, DPM [facility]  Dr. Edrick Kins, DPM    [address],  [address]                Office [phone]  Fax [phone]

## 2020-05-20 ENCOUNTER — Ambulatory Visit
Admission: RE | Admit: 2020-05-20 | Discharge: 2020-05-20 | Disposition: A | Discharge status: Home / Self Care | Payer: BC Managed Care – PPO | Source: Ambulatory Visit | Attending: Internal Medicine | Admitting: Internal Medicine

## 2020-05-20 ENCOUNTER — Other Ambulatory Visit: Payer: Self-pay

## 2020-05-20 DIAGNOSIS — Z1231 Encounter for screening mammogram for malignant neoplasm of breast: Secondary | ICD-10-CM | POA: Diagnosis present

## 2020-06-03 ENCOUNTER — Other Ambulatory Visit: Payer: Self-pay | Admitting: Internal Medicine

## 2020-06-03 DIAGNOSIS — R921 Mammographic calcification found on diagnostic imaging of breast: Secondary | ICD-10-CM

## 2020-06-03 DIAGNOSIS — R928 Other abnormal and inconclusive findings on diagnostic imaging of breast: Secondary | ICD-10-CM

## 2020-06-07 ENCOUNTER — Ambulatory Visit
Admission: RE | Admit: 2020-06-07 | Discharge: 2020-06-07 | Disposition: A | Discharge status: Home / Self Care | Payer: BC Managed Care – PPO | Source: Ambulatory Visit | Attending: Internal Medicine | Admitting: Internal Medicine

## 2020-06-07 ENCOUNTER — Other Ambulatory Visit: Payer: Self-pay

## 2020-06-07 DIAGNOSIS — R921 Mammographic calcification found on diagnostic imaging of breast: Secondary | ICD-10-CM | POA: Diagnosis present

## 2020-06-07 DIAGNOSIS — R928 Other abnormal and inconclusive findings on diagnostic imaging of breast: Secondary | ICD-10-CM

## 2020-06-09 ENCOUNTER — Other Ambulatory Visit: Payer: Self-pay | Admitting: Internal Medicine

## 2020-06-15 ENCOUNTER — Other Ambulatory Visit: Payer: Self-pay | Admitting: Internal Medicine

## 2020-06-15 DIAGNOSIS — R921 Mammographic calcification found on diagnostic imaging of breast: Secondary | ICD-10-CM

## 2020-07-13 ENCOUNTER — Other Ambulatory Visit: Payer: Self-pay | Admitting: Internal Medicine

## 2020-07-20 ENCOUNTER — Telehealth: Payer: Self-pay | Admitting: Podiatry

## 2020-07-20 NOTE — Telephone Encounter (Signed)
Pt left message stating she had not heard anything from [place] and Dr Amalia Hailey said he was going to order another pair of orthotics for her. Please advise?

## 2020-07-21 NOTE — Telephone Encounter (Signed)
Left message for pt apologizing but I got clarification from Dr Amalia Hailey and we have ordered a pair of the thin orthotics just like 2020 pair and I would call when they come in.

## 2020-07-27 ENCOUNTER — Other Ambulatory Visit: Payer: Self-pay | Admitting: *Deleted

## 2020-07-27 ENCOUNTER — Other Ambulatory Visit (INDEPENDENT_AMBULATORY_CARE_PROVIDER_SITE_OTHER): Payer: BC Managed Care – PPO

## 2020-07-27 DIAGNOSIS — R319 Hematuria, unspecified: Secondary | ICD-10-CM

## 2020-07-27 LAB — POCT URINALYSIS DIPSTICK
Bilirubin, UA: POSITIVE
Blood, UA: POSITIVE
Glucose, UA: NEGATIVE
Ketones, UA: NEGATIVE
Leukocytes, UA: NEGATIVE
Nitrite, UA: NEGATIVE
Protein, UA: POSITIVE — AB
Spec Grav, UA: >=1.03 — AB (ref 1.010–1.025)
Urobilinogen, UA: 0.2 E.U./dL
pH, UA: 5.5 (ref 5.0–8.0)

## 2020-07-27 MED ORDER — CIPROFLOXACIN HCL 500 MG PO TABS: 500.0000 mg | ORAL_TABLET | Freq: Two times a day (BID) | ORAL | 0 refills | Status: AC

## 2020-08-03 ENCOUNTER — Other Ambulatory Visit: Payer: Self-pay

## 2020-08-03 ENCOUNTER — Ambulatory Visit: Payer: BC Managed Care – PPO | Admitting: Internal Medicine

## 2020-08-03 ENCOUNTER — Telehealth: Payer: Self-pay | Admitting: Podiatry

## 2020-08-03 ENCOUNTER — Encounter: Payer: Self-pay | Admitting: Internal Medicine

## 2020-08-03 VITALS — BP 134/75 | HR 68 | Ht 67.0 in | Wt 233.0 lb

## 2020-08-03 DIAGNOSIS — E782 Mixed hyperlipidemia: Secondary | ICD-10-CM

## 2020-08-03 DIAGNOSIS — E1169 Type 2 diabetes mellitus with other specified complication: Secondary | ICD-10-CM

## 2020-08-03 DIAGNOSIS — N390 Urinary tract infection, site not specified: Secondary | ICD-10-CM

## 2020-08-03 DIAGNOSIS — I10 Essential (primary) hypertension: Secondary | ICD-10-CM

## 2020-08-03 DIAGNOSIS — E669 Obesity, unspecified: Secondary | ICD-10-CM

## 2020-08-03 LAB — POCT URINALYSIS DIPSTICK
Bilirubin, UA: NEGATIVE
Blood, UA: NEGATIVE
Glucose, UA: NEGATIVE
Ketones, UA: NEGATIVE
Nitrite, UA: NEGATIVE
Protein, UA: POSITIVE — AB
Spec Grav, UA: >=1.03 — AB (ref 1.010–1.025)
Urobilinogen, UA: 0.2 E.U./dL
pH, UA: 6 (ref 5.0–8.0)

## 2020-08-03 LAB — GLUCOSE, POCT (MANUAL RESULT ENTRY): POC Glucose: 114 mg/dl — AB (ref 70–99)

## 2020-08-03 NOTE — Assessment & Plan Note (Signed)
Patient has a UTI. Started on cipro  500 mg twice a day with lots of water

## 2020-08-03 NOTE — Progress Notes (Signed)
Established Patient Office Visit  Subjective:  Patient ID: Jill Holmes, female    DOB: 1955-11-30  Age: 65 y.o. MRN: [id number]  CC:  Chief Complaint  Patient presents with   Urinary Tract Infection    Urinary Tract Infection  This is a recurrent problem. The current episode started in the past 7 days. The quality of the pain is described as aching. The pain is at a severity of 2/10. There has been no fever. Associated symptoms include flank pain. Pertinent negatives include no chills, hematuria, hesitancy, sweats or urgency. She has tried antibiotics for the symptoms.   Jill Holmes presents for general check up  Past Medical History:  Diagnosis Date   Cancer [facility])    thyroid ca   Diabetes mellitus without complication ([place])    Diverticulitis    Hypertension    Thyroid disease     Past Surgical History:  Procedure Laterality Date   ABDOMINAL HYSTERECTOMY     COLONOSCOPY WITH PROPOFOL N/A 02/07/2019   Procedure: COLONOSCOPY WITH PROPOFOL;  Surgeon: Lucilla Lame, MD;  Location: [facility] ENDOSCOPY;  Service: Endoscopy;  Laterality: N/A;    Family History  Problem Relation Age of Onset   Breast cancer Mother 60    Social History   Socioeconomic History   Marital status: Married    Spouse name: Not on file   Number of children: Not on file   Years of education: Not on file   Highest education level: Not on file  Occupational History   Not on file  Tobacco Use   Smoking status: Never   Smokeless tobacco: Never  Substance and Sexual Activity   Alcohol use: No   Drug use: No   Sexual activity: Yes    Birth control/protection: None  Other Topics Concern   Not on file  Social History Narrative   Not on file   Social Determinants of Health   Financial Resource Strain: Not on file  Food Insecurity: Not on file  Transportation Needs: Not on file  Physical Activity: Not on file  Stress: Not on file  Social Connections: Not on file  Intimate Partner Violence:  Not on file     Current Outpatient Medications:    amLODipine (NORVASC) 10 MG tablet, TAKE ONE TABLET EVERY DAY, Disp: 30 tablet, Rfl: 6   Blood Glucose Monitoring Suppl ([place]) w/Device KIT, See admin instructions., Disp: , Rfl:    ciprofloxacin (CIPRO) 500 MG tablet, Take 1 tablet (500 mg total) by mouth 2 (two) times daily for 14 days., Disp: 20 tablet, Rfl: 0   clotrimazole-betamethasone (LOTRISONE) cream, Apply 1 application topically 2 (two) times daily., Disp: 30 g, Rfl: 6   docusate sodium (COLACE) 100 MG capsule, Take 1 capsule (100 mg total) by mouth 2 (two) times daily., Disp: 10 capsule, Rfl: 0   EPINEPHrine 0.3 mg/0.3 mL IJ SOAJ injection, Inject 0.3 mg into the skin., Disp: , Rfl:    gabapentin (NEURONTIN) 100 MG capsule, TAKE 2 CAPSULES BY MOUTH 3 TIMES DAILY, Disp: 540 capsule, Rfl: 1   glimepiride (AMARYL) 2 MG tablet, TAKE 2 TABLETS BY MOUTH AT NIGHTTIME, Disp: 60 tablet, Rfl: 6   glucose blood test strip, , Disp: , Rfl:    levothyroxine (SYNTHROID) 150 MCG tablet, TAKE 1 TABLET BY MOUTH DAILY, Disp: 90 tablet, Rfl: 3   lovastatin (MEVACOR) 20 MG tablet, TAKE 1 TABLET BY MOUTH DAILY, Disp: 90 tablet, Rfl: 0   meloxicam (MOBIC) 15 MG tablet,  Take 1 tablet (15 mg total) by mouth daily., Disp: 90 tablet, Rfl: 2   metFORMIN (GLUCOPHAGE-XR) 500 MG 24 hr tablet, TAKE ONE TABLET BY MOUTH 3 TIMES DAILY, Disp: 90 tablet, Rfl: 1   metoprolol succinate (TOPROL-XL) 100 MG 24 hr tablet, TAKE 1 TABLET BY MOUTH DAILY, Disp: 90 tablet, Rfl: 3   metoprolol succinate (TOPROL-XL) 50 MG 24 hr tablet, metoprolol succinate ER 50 mg tablet,extended release 24 hr, Disp: , Rfl:    nystatin cream (MYCOSTATIN), APPLY TO AFFECTED AREAS 3 TIMES DAILY AS NEEDED, Disp: , Rfl:    ondansetron (ZOFRAN) 4 MG tablet, Take 1 tablet (4 mg total) by mouth daily as needed for nausea or vomiting., Disp: 20 tablet, Rfl: 1   polyethylene glycol powder (GLYCOLAX/MIRALAX) 17 GM/SCOOP powder, Take 17  g by mouth 2 (two) times daily as needed., Disp: 3350 g, Rfl: 1  Current Facility-Administered Medications:    betamethasone acetate-betamethasone sodium phosphate (CELESTONE) injection 3 mg, 3 mg, Intramuscular, Once, Evans, Brent M, DPM   betamethasone acetate-betamethasone sodium phosphate (CELESTONE) injection 3 mg, 3 mg, Intramuscular, Once, Evans, Brent M, DPM   betamethasone acetate-betamethasone sodium phosphate (CELESTONE) injection 3 mg, 3 mg, Intra-articular, Once, Evans, Brent M, DPM   Allergies  Allergen Reactions   Elemental Sulfur Anaphylaxis   Enalapril Anaphylaxis   Lisinopril    Sulfa Antibiotics     ROS Review of Systems  Constitutional: Negative.  Negative for chills.  HENT: Negative.    Eyes: Negative.   Respiratory: Negative.    Cardiovascular: Negative.   Gastrointestinal: Negative.   Endocrine: Negative.   Genitourinary:  Positive for flank pain. Negative for hematuria, hesitancy and urgency.  Skin: Negative.   Allergic/Immunologic: Negative.   Neurological: Negative.   Hematological: Negative.   Psychiatric/Behavioral: Negative.    All other systems reviewed and are negative.    Objective:    Physical Exam Vitals reviewed.  Constitutional:      Appearance: Normal appearance.  HENT:     Mouth/Throat:     Mouth: Mucous membranes are moist.  Eyes:     Pupils: Pupils are equal, round, and reactive to light.  Neck:     Vascular: No carotid bruit.  Cardiovascular:     Rate and Rhythm: Normal rate and regular rhythm.     Pulses: Normal pulses.     Heart sounds: Normal heart sounds.  Pulmonary:     Effort: Pulmonary effort is normal.     Breath sounds: Normal breath sounds.  Abdominal:     General: Bowel sounds are normal.     Palpations: Abdomen is soft. There is no hepatomegaly, splenomegaly or mass.     Tenderness: There is no abdominal tenderness.     Hernia: No hernia is present.  Musculoskeletal:        General: No tenderness.      Cervical back: Neck supple.     Right lower leg: No edema.     Left lower leg: No edema.  Skin:    Coloration: Skin is not jaundiced.     Findings: No bruising or rash.  Neurological:     Mental Status: She is alert and oriented to person, place, and time. Mental status is at baseline.     Motor: No weakness.  Psychiatric:        Mood and Affect: Mood and affect normal.        Behavior: Behavior normal.    BP 134/75   Pulse 68   Ht 5'  7" (1.702 m)   Wt 233 lb (105.7 kg)   BMI 36.49 kg/m  Wt Readings from Last 3 Encounters:  08/03/20 233 lb (105.7 kg)  01/30/20 233 lb (105.7 kg)  09/19/19 (!) 235 lb 1.6 oz (106.6 kg)     Health Maintenance Due  Topic Date Due   PNEUMOCOCCAL POLYSACCHARIDE VACCINE AGE 54-64 HIGH RISK  Never done   COVID-19 Vaccine (1) Never done   FOOT EXAM  Never done   OPHTHALMOLOGY EXAM  Never done   HIV Screening  Never done   Hepatitis C Screening  Never done   TETANUS/TDAP  Never done   PAP SMEAR-Modifier  Never done   Zoster Vaccines- Shingrix (1 of 2) Never done   HEMOGLOBIN A1C  03/14/2020   URINE MICROALBUMIN  09/11/2020    There are no preventive care reminders to display for this patient.  Lab Results  Component Value Date   TSH 0.66 09/12/2019   Lab Results  Component Value Date   WBC 5.2 09/12/2019   HGB 13.5 09/12/2019   HCT 39.9 09/12/2019   MCV 87.9 09/12/2019   PLT 256 09/12/2019   Lab Results  Component Value Date   NA 140 09/12/2019   K 4.1 09/12/2019   CO2 27 09/12/2019   GLUCOSE 149 (H) 09/12/2019   BUN 13 09/12/2019   CREATININE 0.62 09/12/2019   BILITOT 0.8 09/12/2019   ALKPHOS 48 10/26/2014   AST 29 09/12/2019   ALT 28 09/12/2019   PROT 7.3 09/12/2019   ALBUMIN 4.0 10/26/2014   CALCIUM 9.3 09/12/2019   ANIONGAP 9 10/26/2014   Lab Results  Component Value Date   CHOL 153 09/12/2019   Lab Results  Component Value Date   HDL 45 (L) 09/12/2019   Lab Results  Component Value Date   LDLCALC 72  09/12/2019   Lab Results  Component Value Date   TRIG 273 (H) 09/12/2019   Lab Results  Component Value Date   CHOLHDL 3.4 09/12/2019   Lab Results  Component Value Date   HGBA1C 7.6 (H) 09/12/2019      Assessment & Plan:   Problem List Items Addressed This Visit       Cardiovascular and Mediastinum   Hypertension    Patient blood pressure is normal patient denies any chest pain or shortness of breath there is no history of palpitation or paroxysmal nocturnal dyspnea   patient was advised to follow low-salt low-cholesterol diet            Endocrine   Diabetes mellitus (HCC)    Stable at the present time       Relevant Orders   POCT glucose (manual entry) (Completed)     Genitourinary   Urinary tract infection without hematuria - Primary    Patient has a UTI. Started on cipro  500 mg twice a day with lots of water       Relevant Orders   POCT urinalysis dipstick (Completed)     Other   Mixed hyperlipidemia    Patient was advised to follow low-cholesterol diet       Obesity (BMI 30-39.9)    Advised to lose some weight.        No orders of the defined types were placed in this encounter.   Follow-up: No follow-ups on file.    Cletis Athens, MD

## 2020-08-03 NOTE — Assessment & Plan Note (Signed)
Stable at the present time. 

## 2020-08-03 NOTE — Assessment & Plan Note (Signed)
Patient blood pressure is normal patient denies any chest pain or shortness of breath there is no history of palpitation or paroxysmal nocturnal dyspnea   patient was advised to follow low-salt low-cholesterol diet

## 2020-08-03 NOTE — Assessment & Plan Note (Signed)
Advised to lose some weight.

## 2020-08-03 NOTE — Telephone Encounter (Signed)
Remolded orthotics in [place] to be taken to [facility]vm for pt they would be in  office for pick up as on tomorrow 6.14.2022.

## 2020-08-03 NOTE — Assessment & Plan Note (Signed)
Patient was advised to follow low-cholesterol diet

## 2020-08-04 ENCOUNTER — Other Ambulatory Visit: Payer: Self-pay | Admitting: Internal Medicine

## 2020-08-12 ENCOUNTER — Other Ambulatory Visit: Payer: Self-pay | Admitting: Internal Medicine

## 2020-08-13 ENCOUNTER — Ambulatory Visit: Payer: BC Managed Care – PPO | Admitting: Family Medicine

## 2020-09-01 ENCOUNTER — Other Ambulatory Visit: Payer: Self-pay | Admitting: Internal Medicine

## 2020-09-10 ENCOUNTER — Ambulatory Visit: Payer: BC Managed Care – PPO | Admitting: Family Medicine

## 2020-09-10 ENCOUNTER — Other Ambulatory Visit: Payer: Self-pay

## 2020-09-10 ENCOUNTER — Encounter: Payer: Self-pay | Admitting: Family Medicine

## 2020-09-10 VITALS — BP 134/80 | HR 82 | Temp 98.0°F | Resp 16 | Ht 66.0 in | Wt 229.0 lb

## 2020-09-10 DIAGNOSIS — E669 Obesity, unspecified: Secondary | ICD-10-CM

## 2020-09-10 DIAGNOSIS — E7849 Other hyperlipidemia: Secondary | ICD-10-CM

## 2020-09-10 DIAGNOSIS — E1169 Type 2 diabetes mellitus with other specified complication: Secondary | ICD-10-CM | POA: Diagnosis not present

## 2020-09-10 LAB — CBC WITH DIFFERENTIAL/PLATELET
Absolute Monocytes: 426 cells/uL (ref 200–950)
Basophils Absolute: 50 cells/uL (ref 0–200)
Basophils Relative: 0.9 %
Eosinophils Absolute: 196 cells/uL (ref 15–500)
Eosinophils Relative: 3.5 %
HCT: 39.5 % (ref 35.0–45.0)
Hemoglobin: 12.9 g/dL (ref 11.7–15.5)
Lymphs Abs: 1719 cells/uL (ref 850–3900)
MCH: 29.4 pg (ref 27.0–33.0)
MCHC: 32.7 g/dL (ref 32.0–36.0)
MCV: 90 fL (ref 80.0–100.0)
MPV: 11.4 fL (ref 7.5–12.5)
Monocytes Relative: 7.6 %
Neutro Abs: 3209 cells/uL (ref 1500–7800)
Neutrophils Relative %: 57.3 %
Platelets: 251 10*3/uL (ref 140–400)
RBC: 4.39 10*6/uL (ref 3.80–5.10)
RDW: 13.2 % (ref 11.0–15.0)
Total Lymphocyte: 30.7 %
WBC: 5.6 10*3/uL (ref 3.8–10.8)

## 2020-09-10 LAB — LIPID PANEL
Cholesterol: 166 mg/dL (ref <200)
HDL: 47 mg/dL — ABNORMAL LOW (ref >=50)
LDL Cholesterol (Calc): 85 mg/dL (calc)
Non-HDL Cholesterol (Calc): 119 mg/dL (calc) (ref <130)
Total CHOL/HDL Ratio: 3.5 (calc) (ref <5.0)
Triglycerides: 244 mg/dL — ABNORMAL HIGH (ref <150)

## 2020-09-10 LAB — COMPLETE METABOLIC PANEL WITH GFR
AG Ratio: 1.8 (calc) (ref 1.0–2.5)
ALT: 25 U/L (ref 6–29)
AST: 24 U/L (ref 10–35)
Albumin: 4.4 g/dL (ref 3.6–5.1)
Alkaline phosphatase (APISO): 43 U/L (ref 37–153)
BUN: 14 mg/dL (ref 7–25)
CO2: 26 mmol/L (ref 20–32)
Calcium: 9.2 mg/dL (ref 8.6–10.4)
Chloride: 100 mmol/L (ref 98–110)
Creat: 0.74 mg/dL (ref 0.50–1.05)
Globulin: 2.5 g/dL (calc) (ref 1.9–3.7)
Glucose, Bld: 164 mg/dL — ABNORMAL HIGH (ref 65–99)
Potassium: 4.1 mmol/L (ref 3.5–5.3)
Sodium: 140 mmol/L (ref 135–146)
Total Bilirubin: 0.9 mg/dL (ref 0.2–1.2)
Total Protein: 6.9 g/dL (ref 6.1–8.1)
eGFR: 91 mL/min/{1.73_m2} (ref >=60)

## 2020-09-10 LAB — POCT GLYCOSYLATED HEMOGLOBIN (HGB A1C): HbA1c POC (<> result, manual entry): 7.2 % (ref 4.0–5.6)

## 2020-09-10 NOTE — Progress Notes (Signed)
Established Patient Office Visit  SUBJECTIVE:  Subjective  Patient ID: Jill Holmes, female    DOB: 1955/11/06  Age: 65 y.o. MRN: [id number]  CC: No chief complaint on file.   HPI Jill Holmes is a 65 y.o. female presenting today for    Past Medical History:  Diagnosis Date   Cancer ([place])    thyroid ca   Diabetes mellitus without complication ([place])    Diverticulitis    Hypertension    Thyroid disease     Past Surgical History:  Procedure Laterality Date   ABDOMINAL HYSTERECTOMY     COLONOSCOPY WITH PROPOFOL N/A 02/07/2019   Procedure: COLONOSCOPY WITH PROPOFOL;  Surgeon: Lucilla Lame, MD;  Location: [facility] ENDOSCOPY;  Service: Endoscopy;  Laterality: N/A;    Family History  Problem Relation Age of Onset   Breast cancer Mother 70    Social History   Socioeconomic History   Marital status: Married    Spouse name: Not on file   Number of children: Not on file   Years of education: Not on file   Highest education level: Not on file  Occupational History   Not on file  Tobacco Use   Smoking status: Never   Smokeless tobacco: Never  Substance and Sexual Activity   Alcohol use: No   Drug use: No   Sexual activity: Yes    Birth control/protection: None  Other Topics Concern   Not on file  Social History Narrative   Not on file   Social Determinants of Health   Financial Resource Strain: Not on file  Food Insecurity: Not on file  Transportation Needs: Not on file  Physical Activity: Not on file  Stress: Not on file  Social Connections: Not on file  Intimate Partner Violence: Not on file     Current Outpatient Medications:    amLODipine (NORVASC) 10 MG tablet, TAKE ONE TABLET EVERY DAY, Disp: 30 tablet, Rfl: 6   Blood Glucose Monitoring Suppl ([place]) w/Device KIT, See admin instructions., Disp: , Rfl:    clotrimazole-betamethasone (LOTRISONE) cream, Apply 1 application topically 2 (two) times daily., Disp: 30 g, Rfl: 6    docusate sodium (COLACE) 100 MG capsule, Take 1 capsule (100 mg total) by mouth 2 (two) times daily., Disp: 10 capsule, Rfl: 0   EPINEPHrine 0.3 mg/0.3 mL IJ SOAJ injection, Inject 0.3 mg into the skin., Disp: , Rfl:    gabapentin (NEURONTIN) 100 MG capsule, TAKE 2 CAPSULES BY MOUTH 3 TIMES DAILY, Disp: 540 capsule, Rfl: 1   glimepiride (AMARYL) 2 MG tablet, TAKE 2 TABLETS BY MOUTH AT NIGHTTIME, Disp: 60 tablet, Rfl: 6   glucose blood test strip, , Disp: , Rfl:    levothyroxine (SYNTHROID) 150 MCG tablet, TAKE 1 TABLET BY MOUTH DAILY, Disp: 90 tablet, Rfl: 3   lovastatin (MEVACOR) 20 MG tablet, TAKE 1 TABLET BY MOUTH DAILY, Disp: 90 tablet, Rfl: 0   meloxicam (MOBIC) 15 MG tablet, Take 1 tablet (15 mg total) by mouth daily., Disp: 90 tablet, Rfl: 2   metFORMIN (GLUCOPHAGE-XR) 500 MG 24 hr tablet, TAKE ONE TABLET BY MOUTH 3 TIMES DAILY, Disp: 90 tablet, Rfl: 1   metoprolol succinate (TOPROL-XL) 100 MG 24 hr tablet, TAKE 1 TABLET BY MOUTH DAILY, Disp: 90 tablet, Rfl: 3   metoprolol succinate (TOPROL-XL) 50 MG 24 hr tablet, metoprolol succinate ER 50 mg tablet,extended release 24 hr, Disp: , Rfl:    nystatin cream (MYCOSTATIN), APPLY TO AFFECTED AREAS 3 TIMES  DAILY AS NEEDED, Disp: , Rfl:    ondansetron (ZOFRAN) 4 MG tablet, Take 1 tablet (4 mg total) by mouth daily as needed for nausea or vomiting., Disp: 20 tablet, Rfl: 1   polyethylene glycol powder (GLYCOLAX/MIRALAX) 17 GM/SCOOP powder, Take 17 g by mouth 2 (two) times daily as needed., Disp: 3350 g, Rfl: 1  Current Facility-Administered Medications:    betamethasone acetate-betamethasone sodium phosphate (CELESTONE) injection 3 mg, 3 mg, Intramuscular, Once, Evans, Brent M, DPM   betamethasone acetate-betamethasone sodium phosphate (CELESTONE) injection 3 mg, 3 mg, Intramuscular, Once, Evans, Brent M, DPM   betamethasone acetate-betamethasone sodium phosphate (CELESTONE) injection 3 mg, 3 mg, Intra-articular, Once, Evans, Brent M, DPM    Allergies  Allergen Reactions   Elemental Sulfur Anaphylaxis   Enalapril Anaphylaxis   Lisinopril    Sulfa Antibiotics     ROS Review of Systems  Constitutional: Negative.   HENT: Negative.    Respiratory: Negative.    Cardiovascular: Negative.   Genitourinary: Negative.   Musculoskeletal: Negative.   Psychiatric/Behavioral: Negative.      OBJECTIVE:    Physical Exam Constitutional:      Appearance: She is obese.  HENT:     Head: Normocephalic.     Right Ear: Tympanic membrane normal.     Left Ear: Tympanic membrane normal.     Mouth/Throat:     Mouth: Mucous membranes are moist.  Cardiovascular:     Rate and Rhythm: Normal rate and regular rhythm.  Pulmonary:     Effort: Pulmonary effort is normal.  Musculoskeletal:        General: Normal range of motion.  Skin:    General: Skin is warm.  Psychiatric:        Mood and Affect: Mood normal.    BP 134/80   Pulse 82   Temp 98 F (36.7 C)   Resp 16   Ht '5\' 6"'  (1.676 m)   Wt 229 lb (103.9 kg)   BMI 36.96 kg/m  Wt Readings from Last 3 Encounters:  09/10/20 229 lb (103.9 kg)  08/03/20 233 lb (105.7 kg)  01/30/20 233 lb (105.7 kg)    Health Maintenance Due  Topic Date Due   PNEUMOCOCCAL POLYSACCHARIDE VACCINE AGE 6-64 HIGH RISK  Never done   COVID-19 Vaccine (1) Never done   FOOT EXAM  Never done   OPHTHALMOLOGY EXAM  Never done   HIV Screening  Never done   Hepatitis C Screening  Never done   TETANUS/TDAP  Never done   PAP SMEAR-Modifier  Never done   Zoster Vaccines- Shingrix (1 of 2) Never done   URINE MICROALBUMIN  09/11/2020   INFLUENZA VACCINE  09/20/2020    There are no preventive care reminders to display for this patient.  CBC Latest Ref Rng & Units 09/10/2020 09/12/2019 10/26/2014  WBC 3.8 - 10.8 Thousand/uL 5.6 5.2 6.1  Hemoglobin 11.7 - 15.5 g/dL 12.9 13.5 13.5  Hematocrit 35.0 - 45.0 % 39.5 39.9 39.8  Platelets 140 - 400 Thousand/uL 251 256 224   CMP Latest Ref Rng & Units 09/10/2020  09/12/2019 10/26/2014  Glucose 65 - 99 mg/dL 164(H) 149(H) 150(H)  BUN 7 - 25 mg/dL '14 13 12  ' Creatinine 0.50 - 1.05 mg/dL 0.74 0.62 0.70  Sodium 135 - 146 mmol/L 140 140 140  Potassium 3.5 - 5.3 mmol/L 4.1 4.1 3.4(L)  Chloride 98 - 110 mmol/L 100 101 103  CO2 20 - 32 mmol/L '26 27 28  ' Calcium 8.6 - 10.4 mg/dL  9.2 9.3 9.4  Total Protein 6.1 - 8.1 g/dL 6.9 7.3 6.9  Total Bilirubin 0.2 - 1.2 mg/dL 0.9 0.8 0.8  Alkaline Phos 38 - 126 U/L - - 48  AST 10 - 35 U/L 24 29 41  ALT 6 - 29 U/L '25 28 28    ' Lab Results  Component Value Date   TSH 0.66 09/12/2019   Lab Results  Component Value Date   ALBUMIN 4.0 10/26/2014   ANIONGAP 9 10/26/2014   EGFR 91 09/10/2020   Lab Results  Component Value Date   CHOL 166 09/10/2020   CHOL 153 09/12/2019   HDL 47 (L) 09/10/2020   HDL 45 (L) 09/12/2019   LDLCALC 85 09/10/2020   LDLCALC 72 09/12/2019   CHOLHDL 3.5 09/10/2020   CHOLHDL 3.4 09/12/2019   Lab Results  Component Value Date   TRIG 244 (H) 09/10/2020   Lab Results  Component Value Date   HGBA1C 7.2 09/10/2020   HGBA1C 7.6 (H) 09/12/2019      ASSESSMENT & PLAN:   Problem List Items Addressed This Visit       Endocrine   Diabetes mellitus ([place]) - Primary    Patient in for DM eval with A1C today which was 7.2       Relevant Orders   POCT HgB A1C (Completed)   COMPLETE METABOLIC PANEL WITH GFR (Completed)   CBC w/Diff/Platelet (Completed)     Other   Obesity (BMI 30-39.9)    Patient has been exercising 3-4 x weekly.  Plan- Continue diet and exercise.         Other hyperlipidemia   Relevant Orders   Lipid Profile (Completed)    No orders of the defined types were placed in this encounter.     Follow-up: No follow-ups on file.    Beckie Salts, [place] [address] [place], [place] [address]

## 2020-09-10 NOTE — Assessment & Plan Note (Signed)
Patient has been exercising 3-4 x weekly.  Plan- Continue diet and exercise.

## 2020-09-10 NOTE — Assessment & Plan Note (Signed)
Patient in for DM eval with A1C today which was 7.2

## 2020-10-02 ENCOUNTER — Other Ambulatory Visit: Payer: Self-pay | Admitting: Internal Medicine

## 2020-10-16 ENCOUNTER — Other Ambulatory Visit: Payer: Self-pay | Admitting: Internal Medicine

## 2020-11-01 ENCOUNTER — Other Ambulatory Visit: Payer: Self-pay

## 2020-11-01 MED ORDER — TRIAMCINOLONE ACETONIDE 0.1 % EX CREA: 1.0000 "application " | TOPICAL_CREAM | Freq: Two times a day (BID) | CUTANEOUS | 0 refills | Status: DC

## 2020-11-05 ENCOUNTER — Other Ambulatory Visit: Payer: Self-pay | Admitting: Internal Medicine

## 2020-11-06 ENCOUNTER — Other Ambulatory Visit: Payer: Self-pay | Admitting: Podiatry

## 2020-11-24 ENCOUNTER — Other Ambulatory Visit: Payer: Self-pay | Admitting: Internal Medicine

## 2020-11-27 ENCOUNTER — Other Ambulatory Visit: Payer: Self-pay | Admitting: Internal Medicine

## 2020-12-03 ENCOUNTER — Other Ambulatory Visit: Payer: Self-pay | Admitting: Internal Medicine

## 2020-12-06 LAB — HM DIABETES EYE EXAM

## 2020-12-08 ENCOUNTER — Other Ambulatory Visit: Payer: Self-pay

## 2020-12-08 ENCOUNTER — Encounter: Payer: Self-pay | Admitting: Internal Medicine

## 2020-12-08 ENCOUNTER — Ambulatory Visit: Payer: BC Managed Care – PPO | Admitting: Internal Medicine

## 2020-12-08 DIAGNOSIS — E1169 Type 2 diabetes mellitus with other specified complication: Secondary | ICD-10-CM

## 2020-12-08 DIAGNOSIS — E7849 Other hyperlipidemia: Secondary | ICD-10-CM

## 2020-12-08 DIAGNOSIS — E669 Obesity, unspecified: Secondary | ICD-10-CM

## 2020-12-08 DIAGNOSIS — I10 Essential (primary) hypertension: Secondary | ICD-10-CM | POA: Diagnosis not present

## 2020-12-08 DIAGNOSIS — M19079 Primary osteoarthritis, unspecified ankle and foot: Secondary | ICD-10-CM | POA: Diagnosis not present

## 2020-12-08 LAB — POCT GLYCOSYLATED HEMOGLOBIN (HGB A1C): HbA1c POC (<> result, manual entry): 7.6 % (ref 4.0–5.6)

## 2020-12-08 NOTE — Progress Notes (Signed)
Established Patient Office Visit  Subjective:  Patient ID: Jill Holmes, female    DOB: 31-Dec-1955  Age: 65 y.o. MRN: [id number]  CC:  Chief Complaint  Patient presents with   Diabetes    Diabetes   Jill Holmes presents for general checkup, patient is known to have diabetes and diverticulitis hypertension thyroid problem she had a hysterectomy and colonoscopy in the past Past Medical History:  Diagnosis Date   Cancer [facility])    thyroid ca   Diabetes mellitus without complication ([place])    Diverticulitis    Hypertension    Thyroid disease     Past Surgical History:  Procedure Laterality Date   ABDOMINAL HYSTERECTOMY     COLONOSCOPY WITH PROPOFOL N/A 02/07/2019   Procedure: COLONOSCOPY WITH PROPOFOL;  Surgeon: Lucilla Lame, MD;  Location: [facility] ENDOSCOPY;  Service: Endoscopy;  Laterality: N/A;    Family History  Problem Relation Age of Onset   Breast cancer Mother 58    Social History   Socioeconomic History   Marital status: Married    Spouse name: Not on file   Number of children: Not on file   Years of education: Not on file   Highest education level: Not on file  Occupational History   Not on file  Tobacco Use   Smoking status: Never   Smokeless tobacco: Never  Substance and Sexual Activity   Alcohol use: No   Drug use: No   Sexual activity: Yes    Birth control/protection: None  Other Topics Concern   Not on file  Social History Narrative   Not on file   Social Determinants of Health   Financial Resource Strain: Not on file  Food Insecurity: Not on file  Transportation Needs: Not on file  Physical Activity: Not on file  Stress: Not on file  Social Connections: Not on file  Intimate Partner Violence: Not on file     Current Outpatient Medications:    amLODipine (NORVASC) 10 MG tablet, TAKE ONE TABLET EVERY DAY, Disp: 30 tablet, Rfl: 6   Blood Glucose Monitoring Suppl ([place]) w/Device KIT, See admin instructions., Disp:  , Rfl:    clotrimazole-betamethasone (LOTRISONE) cream, Apply 1 application topically 2 (two) times daily., Disp: 30 g, Rfl: 6   docusate sodium (COLACE) 100 MG capsule, Take 1 capsule (100 mg total) by mouth 2 (two) times daily., Disp: 10 capsule, Rfl: 0   EPINEPHrine 0.3 mg/0.3 mL IJ SOAJ injection, Inject 0.3 mg into the skin., Disp: , Rfl:    gabapentin (NEURONTIN) 100 MG capsule, TAKE 2 CAPSULES BY MOUTH 3 TIMES DAILY, Disp: 540 capsule, Rfl: 1   glimepiride (AMARYL) 2 MG tablet, TAKE 2 TABLETS BY MOUTH AT NIGHTTIME, Disp: 60 tablet, Rfl: 6   glucose blood test strip, , Disp: , Rfl:    levothyroxine (SYNTHROID) 150 MCG tablet, TAKE 1 TABLET BY MOUTH DAILY, Disp: 90 tablet, Rfl: 3   lovastatin (MEVACOR) 20 MG tablet, TAKE 1 TABLET BY MOUTH DAILY, Disp: 90 tablet, Rfl: 0   meloxicam (MOBIC) 15 MG tablet, TAKE 1 TABLET BY MOUTH DAILY, Disp: 90 tablet, Rfl: 2   metFORMIN (GLUCOPHAGE-XR) 500 MG 24 hr tablet, TAKE ONE TABLET BY MOUTH 3 TIMES DAILY, Disp: 90 tablet, Rfl: 1   metoprolol succinate (TOPROL-XL) 100 MG 24 hr tablet, TAKE 1 TABLET BY MOUTH DAILY, Disp: 90 tablet, Rfl: 3   metoprolol succinate (TOPROL-XL) 50 MG 24 hr tablet, metoprolol succinate ER 50 mg tablet,extended release 24  hr, Disp: , Rfl:    nystatin cream (MYCOSTATIN), APPLY TO AFFECTED AREAS 3 TIMES DAILY AS NEEDED, Disp: , Rfl:    ondansetron (ZOFRAN) 4 MG tablet, Take 1 tablet (4 mg total) by mouth daily as needed for nausea or vomiting., Disp: 20 tablet, Rfl: 1   polyethylene glycol powder (GLYCOLAX/MIRALAX) 17 GM/SCOOP powder, Take 17 g by mouth 2 (two) times daily as needed., Disp: 3350 g, Rfl: 1   triamcinolone cream (KENALOG) 0.1 %, APPLY 1 APPLICATION TWICE DAILY, Disp: 30 g, Rfl: 0  Current Facility-Administered Medications:    betamethasone acetate-betamethasone sodium phosphate (CELESTONE) injection 3 mg, 3 mg, Intramuscular, Once, Evans, Brent M, DPM   betamethasone acetate-betamethasone sodium phosphate (CELESTONE)  injection 3 mg, 3 mg, Intramuscular, Once, Evans, Brent M, DPM   betamethasone acetate-betamethasone sodium phosphate (CELESTONE) injection 3 mg, 3 mg, Intra-articular, Once, Evans, Brent M, DPM   Allergies  Allergen Reactions   Elemental Sulfur Anaphylaxis   Enalapril Anaphylaxis   Lisinopril    Sulfa Antibiotics     ROS Review of Systems  Constitutional: Negative.   HENT: Negative.    Eyes: Negative.   Respiratory: Negative.    Cardiovascular: Negative.   Gastrointestinal: Negative.   Endocrine: Negative.   Genitourinary: Negative.   Musculoskeletal: Negative.   Skin: Negative.   Allergic/Immunologic: Negative.   Neurological: Negative.   Hematological: Negative.   Psychiatric/Behavioral: Negative.    All other systems reviewed and are negative.    Objective:    Physical Exam Vitals reviewed.  Constitutional:      Appearance: Normal appearance.  HENT:     Mouth/Throat:     Mouth: Mucous membranes are moist.  Eyes:     Pupils: Pupils are equal, round, and reactive to light.  Neck:     Vascular: No carotid bruit.  Cardiovascular:     Rate and Rhythm: Normal rate and regular rhythm.     Pulses: Normal pulses.     Heart sounds: Normal heart sounds.  Pulmonary:     Effort: Pulmonary effort is normal.     Breath sounds: Normal breath sounds.  Abdominal:     General: Bowel sounds are normal.     Palpations: Abdomen is soft. There is no hepatomegaly, splenomegaly or mass.     Tenderness: There is no abdominal tenderness.     Hernia: No hernia is present.  Musculoskeletal:        General: No tenderness.     Cervical back: Neck supple.     Right lower leg: No edema.     Left lower leg: No edema.  Skin:    Findings: No rash.  Neurological:     Mental Status: She is alert and oriented to person, place, and time.     Motor: No weakness.  Psychiatric:        Mood and Affect: Mood and affect normal.        Behavior: Behavior normal.    There were no vitals  taken for this visit. Wt Readings from Last 3 Encounters:  09/10/20 229 lb (103.9 kg)  08/03/20 233 lb (105.7 kg)  01/30/20 233 lb (105.7 kg)     Health Maintenance Due  Topic Date Due   COVID-19 Vaccine (1) Never done   Pneumococcal Vaccine 66-2 Years old (1 - PCV) Never done   HIV Screening  Never done   Hepatitis C Screening  Never done   TETANUS/TDAP  Never done   Zoster Vaccines- Shingrix (1 of 2) Never  done   URINE MICROALBUMIN  09/11/2020   INFLUENZA VACCINE  09/20/2020    There are no preventive care reminders to display for this patient.  Lab Results  Component Value Date   TSH 0.66 09/12/2019   Lab Results  Component Value Date   WBC 5.6 09/10/2020   HGB 12.9 09/10/2020   HCT 39.5 09/10/2020   MCV 90.0 09/10/2020   PLT 251 09/10/2020   Lab Results  Component Value Date   NA 140 09/10/2020   K 4.1 09/10/2020   CO2 26 09/10/2020   GLUCOSE 164 (H) 09/10/2020   BUN 14 09/10/2020   CREATININE 0.74 09/10/2020   BILITOT 0.9 09/10/2020   ALKPHOS 48 10/26/2014   AST 24 09/10/2020   ALT 25 09/10/2020   PROT 6.9 09/10/2020   ALBUMIN 4.0 10/26/2014   CALCIUM 9.2 09/10/2020   ANIONGAP 9 10/26/2014   EGFR 91 09/10/2020   Lab Results  Component Value Date   CHOL 166 09/10/2020   Lab Results  Component Value Date   HDL 47 (L) 09/10/2020   Lab Results  Component Value Date   LDLCALC 85 09/10/2020   Lab Results  Component Value Date   TRIG 244 (H) 09/10/2020   Lab Results  Component Value Date   CHOLHDL 3.5 09/10/2020   Lab Results  Component Value Date   HGBA1C 7.6 12/08/2020      Assessment & Plan:   Problem List Items Addressed This Visit       Cardiovascular and Mediastinum   Hypertension    The following hypertensive lifestyle modification were recommended and discussed:  1. Limiting alcohol intake to less than 1 oz/day of ethanol:(24 oz of beer or 8 oz of wine or 2 oz of 100-proof whiskey). 2. Take baby ASA 81 mg daily. 3.  Importance of regular aerobic exercise and losing weight. 4. Reduce dietary saturated fat and cholesterol intake for overall cardiovascular health. 5. Maintaining adequate dietary potassium, calcium, and magnesium intake. 6. Regular monitoring of the blood pressure. 7. Reduce sodium intake to less than 100 mmol/day (less than 2.3 gm of sodium or less than 6 gm of sodium choride)         Endocrine   Diabetes mellitus ([place]) - Primary    - The patient's blood sugar is labile on med. - The patient will continue the current treatment regimen.  - I encouraged the patient to regularly check blood sugar.  - I encouraged the patient to monitor diet. I encouraged the patient to eat low-carb and low-sugar to help prevent blood sugar spikes.  - I encouraged the patient to continue following their prescribed treatment plan for diabetes - I informed the patient to get help if blood sugar drops below 93m/dL, or if suddenly have trouble thinking clearly or breathing.  Patient was advised to buy a book on diabetes from a local bookstore or from [place]  Patient should read 2 chapters every day to keep the motivation going, this is in addition to some of the materials we provided them from the office.  There are other resources on the Internet like YouTube and wilkipedia to get an education on the diabetes      Relevant Orders   POCT HgB A1C (Completed)   Microalbumin, urine     Musculoskeletal and Integument   Primary localized osteoarthrosis of ankle and foot     Other   Obesity (BMI 30-39.9)        Use a measuring cup to measure  serving sizes. You could also try weighing out portions on a kitchen scale. With time, you will be able to estimate serving sizes for some foods.  Take time to put servings of different foods on your favorite plates or in your favorite bowls and cups so you know what a serving looks like.  Try not to eat straight from a food's packaging, such as from a bag or box. Eating  straight from the package makes it hard to see how much you are eating and can lead to overeating. Put the amount you would like to eat in a cup or on a plate to make sure you are eating the right portion.  Use smaller plates, glasses, and bowls for smaller portions and to prevent overeating.  Try not to multitask. For example, avoid watching TV or using your computer while eating. If it is time to eat, sit down at a table and enjoy your food. This will help you recognize when you are full. It will also help you be more mindful of what and how much you are eating. What are tips for following this plan? Reading food labels  Check the calorie count compared with the serving size. The serving size may be smaller than what you are used to eating.  Check the source of the calories. Try to choose foods that are high in protein, fiber, and vitamins, and low in saturated fat, trans fat, and sodium. Shopping  Read nutrition labels while you shop. This will help you make healthy decisions about which foods to buy.  Pay attention to nutrition labels for low-fat or fat-free foods. These foods sometimes have the same number of calories or more calories than the full-fat versions. They also often have added sugar, starch, or salt to make up for flavor that was removed with the fat.  Make a grocery list of lower-calorie foods and stick to it. Cooking  Try to cook your favorite foods in a healthier way. For example, try baking instead of frying.  Use low-fat dairy products. Meal planning  Use more fruits and vegetables. One-half of your plate should be fruits and vegetables.  Include lean proteins, such as chicken, [place], and fish. Lifestyle Each week, aim to do one of the following:  150 minutes of moderate exercise, such as walking.  75 minutes of vigorous exercise, such as running. General information  Know how many calories are in the foods you eat most often. This will help you calculate  calorie counts faster.  Find a way of tracking calories that works for you. Get creative. Try different apps or programs if writing down calories does not work for you.      Other hyperlipidemia    Hypercholesterolemia  I advised the patient to follow Mediterranean diet This diet is rich in fruits vegetables and whole grain, and This diet is also rich in fish and lean meat Patient should also eat a handful of almonds or walnuts daily Recent heart study indicated that average follow-up on this kind of diet reduces the cardiovascular mortality by 50 to 70%==       No orders of the defined types were placed in this encounter.   Follow-up: No follow-ups on file.    Cletis Athens, MD

## 2020-12-08 NOTE — Assessment & Plan Note (Signed)

## 2020-12-08 NOTE — Assessment & Plan Note (Signed)
   Use a measuring cup to measure serving sizes. You could also try weighing out portions on a kitchen scale. With time, you will be able to estimate serving sizes for some foods.  Take time to put servings of different foods on your favorite plates or in your favorite bowls and cups so you know what a serving looks like.  Try not to eat straight from a food's packaging, such as from a bag or box. Eating straight from the package makes it hard to see how much you are eating and can lead to overeating. Put the amount you would like to eat in a cup or on a plate to make sure you are eating the right portion.  Use smaller plates, glasses, and bowls for smaller portions and to prevent overeating.  Try not to multitask. For example, avoid watching TV or using your computer while eating. If it is time to eat, sit down at a table and enjoy your food. This will help you recognize when you are full. It will also help you be more mindful of what and how much you are eating. What are tips for following this plan? Reading food labels  Check the calorie count compared with the serving size. The serving size may be smaller than what you are used to eating.  Check the source of the calories. Try to choose foods that are high in protein, fiber, and vitamins, and low in saturated fat, trans fat, and sodium. Shopping  Read nutrition labels while you shop. This will help you make healthy decisions about which foods to buy.  Pay attention to nutrition labels for low-fat or fat-free foods. These foods sometimes have the same number of calories or more calories than the full-fat versions. They also often have added sugar, starch, or salt to make up for flavor that was removed with the fat.  Make a grocery list of lower-calorie foods and stick to it. Cooking  Try to cook your favorite foods in a healthier way. For example, try baking instead of frying.  Use low-fat dairy products. Meal planning  Use more  fruits and vegetables. One-half of your plate should be fruits and vegetables.  Include lean proteins, such as chicken, [place], and fish. Lifestyle Each week, aim to do one of the following:  150 minutes of moderate exercise, such as walking.  75 minutes of vigorous exercise, such as running. General information  Know how many calories are in the foods you eat most often. This will help you calculate calorie counts faster.  Find a way of tracking calories that works for you. Get creative. Try different apps or programs if writing down calories does not work for you.

## 2020-12-08 NOTE — Assessment & Plan Note (Signed)
Hypercholesterolemia  I advised the patient to follow Mediterranean diet This diet is rich in fruits vegetables and whole grain, and This diet is also rich in fish and lean meat Patient should also eat a handful of almonds or walnuts daily Recent heart study indicated that average follow-up on this kind of diet reduces the cardiovascular mortality by 50 to 70%== 

## 2020-12-08 NOTE — Assessment & Plan Note (Signed)

## 2020-12-09 LAB — MICROALBUMIN, URINE: Microalb, Ur: 4.2 mg/dL

## 2020-12-20 ENCOUNTER — Other Ambulatory Visit: Payer: Self-pay

## 2020-12-20 ENCOUNTER — Ambulatory Visit
Admission: RE | Admit: 2020-12-20 | Discharge: 2020-12-20 | Disposition: A | Discharge status: Home / Self Care | Payer: BC Managed Care – PPO | Source: Ambulatory Visit | Attending: Internal Medicine | Admitting: Internal Medicine

## 2020-12-20 DIAGNOSIS — R921 Mammographic calcification found on diagnostic imaging of breast: Secondary | ICD-10-CM

## 2020-12-22 ENCOUNTER — Other Ambulatory Visit: Payer: Self-pay | Admitting: Internal Medicine

## 2020-12-22 DIAGNOSIS — R921 Mammographic calcification found on diagnostic imaging of breast: Secondary | ICD-10-CM

## 2021-01-04 ENCOUNTER — Other Ambulatory Visit: Payer: Self-pay | Admitting: *Deleted

## 2021-01-04 MED ORDER — AZITHROMYCIN 250 MG PO TABS: ORAL_TABLET | ORAL | 0 refills | Status: AC

## 2021-01-11 ENCOUNTER — Ambulatory Visit (INDEPENDENT_AMBULATORY_CARE_PROVIDER_SITE_OTHER): Payer: BC Managed Care – PPO | Admitting: *Deleted

## 2021-01-11 ENCOUNTER — Other Ambulatory Visit: Payer: Self-pay

## 2021-01-11 DIAGNOSIS — R6889 Other general symptoms and signs: Secondary | ICD-10-CM

## 2021-01-11 LAB — POC COVID19 BINAXNOW: SARS Coronavirus 2 Ag: POSITIVE — AB

## 2021-01-17 ENCOUNTER — Other Ambulatory Visit: Payer: Self-pay | Admitting: Internal Medicine

## 2021-02-01 ENCOUNTER — Other Ambulatory Visit: Payer: Self-pay | Admitting: Internal Medicine

## 2021-03-01 ENCOUNTER — Other Ambulatory Visit: Payer: Self-pay | Admitting: Internal Medicine

## 2021-03-07 ENCOUNTER — Other Ambulatory Visit: Payer: Self-pay | Admitting: Internal Medicine

## 2021-03-09 ENCOUNTER — Other Ambulatory Visit: Payer: Self-pay | Admitting: Internal Medicine

## 2021-03-14 ENCOUNTER — Other Ambulatory Visit: Payer: Self-pay | Admitting: Internal Medicine

## 2021-05-24 ENCOUNTER — Ambulatory Visit: Payer: BC Managed Care – PPO | Admitting: Podiatry

## 2021-05-25 ENCOUNTER — Telehealth: Payer: Self-pay | Admitting: Podiatry

## 2021-05-25 NOTE — Telephone Encounter (Signed)
Patient called the office. She had an appointment yesterday and she forgot about it. Patient is having pain in her foot. Patient states she is on meloxicam and she was asking if she can up her dose of it. Patient scheduled next available appt which is 06/14/2021 with Dr Amalia Hailey. ?

## 2021-05-26 NOTE — Telephone Encounter (Signed)
Maximum dose for meloxicam is 15 mg daily and the patient is already on this.  She can also take Tylenol with this.  Recommend rest ice compression and elevation.  Please notify patient.  Thanks, Dr. Amalia Hailey

## 2021-05-26 NOTE — Telephone Encounter (Signed)
Patient notified via voice mail of Dr. Amalia Hailey recommendations and to keep follow up appt on 06/14/2021 ?

## 2021-05-30 ENCOUNTER — Ambulatory Visit: Payer: BC Managed Care – PPO | Admitting: Dermatology

## 2021-05-30 DIAGNOSIS — L508 Other urticaria: Secondary | ICD-10-CM

## 2021-05-30 DIAGNOSIS — L503 Dermatographic urticaria: Secondary | ICD-10-CM

## 2021-05-30 DIAGNOSIS — L509 Urticaria, unspecified: Secondary | ICD-10-CM

## 2021-05-30 DIAGNOSIS — L299 Pruritus, unspecified: Secondary | ICD-10-CM | POA: Diagnosis not present

## 2021-05-30 MED ORDER — TRIAMCINOLONE ACETONIDE 0.1 % EX CREA: 1.0000 "application " | TOPICAL_CREAM | Freq: Every day | CUTANEOUS | 1 refills | Status: DC

## 2021-05-30 NOTE — Patient Instructions (Addendum)
Start Allegra 180 mg 1 tablet in the morning - start with 1 tablet daily for a week or then increase to 2 tablets daily if not improving. May increase up to 4 tablets daily as needed. ? ?Continue benadryl at bedtime as needed for itch. ? ? ?If You Need Anything After Your Visit ? ?If you have any questions or concerns for your doctor, please call our main line at [phone] and press option 4 to reach your doctor's medical assistant. If no one answers, please leave a voicemail as directed and we will return your call as soon as possible. Messages left after 4 pm will be answered the following business day.  ? ?You may also send [place] a message via MyChart. We typically respond to MyChart messages within 1-2 business days. ? ?For prescription refills, please ask your pharmacy to contact our office. Our fax number is [phone]. ? ?If you have an urgent issue when the clinic is closed that cannot wait until the next business day, you can page your doctor at the number below.   ? ?Please note that while we do our best to be available for urgent issues outside of office hours, we are not available 24/7.  ? ?If you have an urgent issue and are unable to reach [place], you may choose to seek medical care at your doctor's office, retail clinic, urgent care center, or emergency room. ? ?If you have a medical emergency, please immediately call 911 or go to the emergency department. ? ?Pager Numbers ? ?- Dr. Nehemiah Massed: [phone] ? ?- Dr. Laurence Ferrari: [phone] ? ?- Dr. Nicole Kindred: [phone] ? ?In the event of inclement weather, please call our main line at [phone] for an update on the status of any delays or closures. ? ?Dermatology Medication Tips: ?Please keep the boxes that topical medications come in in order to help keep track of the instructions about where and how to use these. Pharmacies typically print the medication instructions only on the boxes and not directly on the medication tubes.  ? ?If your medication is too  expensive, please contact our office at [phone] option 4 or send [place] a message through [place].  ? ?We are unable to tell what your co-pay for medications will be in advance as this is different depending on your insurance coverage. However, we may be able to find a substitute medication at lower cost or fill out paperwork to get insurance to cover a needed medication.  ? ?If a prior authorization is required to get your medication covered by your insurance company, please allow [place] 1-2 business days to complete this process. ? ?Drug prices often vary depending on where the prescription is filled and some pharmacies may offer cheaper prices. ? ?The website www.goodrx.com contains coupons for medications through different pharmacies. The prices here do not account for what the cost may be with help from insurance (it may be cheaper with your insurance), but the website can give you the price if you did not use any insurance.  ?- You can print the associated coupon and take it with your prescription to the pharmacy.  ?- You may also stop by our office during regular business hours and pick up a GoodRx coupon card.  ?- If you need your prescription sent electronically to a different pharmacy, notify our office through [facility] or by phone at [phone] option 4. ? ? ? ? ?Si Usted Necesita Algo Despu?s de Su Visita ? ?Tambi?n puede enviarnos un mensaje a trav?s  de MyChart. Por lo general respondemos a los mensajes de MyChart en el transcurso de 1 a 2 d?as h?biles. ? ?Para renovar recetas, por favor pida a su farmacia que se ponga en contacto con nuestra oficina. Nuestro n?mero de fax es el [phone]. ? ?Si tiene un asunto urgente cuando la cl?nica est? cerrada y que no puede esperar hasta el siguiente d?a h?bil, puede llamar/localizar a su doctor(a) al n?mero que aparece a continuaci?n.  ? ?Por favor, tenga en cuenta que aunque hacemos todo lo posible para estar disponibles para asuntos urgentes fuera  del horario de oficina, no estamos disponibles las 24 horas del d?a, los 7 d?as de la semana.  ? ?Si tiene un problema urgente y no puede comunicarse con nosotros, puede optar por buscar atenci?n m?dica  en el consultorio de su doctor(a), en una cl?nica privada, en un centro de atenci?n urgente o en una sala de emergencias. ? ?Si tiene Engineer, maintenance (IT) m?dica, por favor llame inmediatamente al 911 o vaya a la sala de emergencias. ? ?N?meros de b?per ? ?- Dr. Nehemiah Massed: [phone] ? ?- Dra. Moye: [phone] ? ?- Dra. Nicole Kindred: [phone] ? ?En caso de inclemencias del tiempo, por favor llame a nuestra l?nea principal al [phone] para una actualizaci?n sobre el estado de cualquier retraso o cierre. ? ?Consejos para la medicaci?n en dermatolog?a: ?Por favor, guarde las cajas en las que vienen los medicamentos de uso t?pico para ayudarle a seguir las instrucciones sobre d?nde y c?mo usarlos. Las farmacias generalmente imprimen las instrucciones del medicamento s?lo en las cajas y no directamente en los tubos del [place].  ? ?Si su medicamento es muy caro, por favor, p?ngase en contacto con Zigmund Daniel llamando al [phone] y presione la opci?n 4 o env?enos un mensaje a trav?s de MyChart.  ? ?No podemos decirle cu?l ser? su copago por los medicamentos por adelantado ya que esto es diferente dependiendo de la cobertura de su seguro. Sin embargo, es posible que podamos encontrar un medicamento sustituto a Electrical engineer un formulario para que el seguro cubra el medicamento que se considera necesario.  ? ?Si se requiere Ardelia Mems autorizaci?n previa para que su compa??a de seguros [place] su medicamento, por favor perm?tanos de 1 a 2 d?as h?biles para completar este proceso. ? ?Los precios de los medicamentos var?an con frecuencia dependiendo del Environmental consultant de d?nde se surte la receta y alguna farmacias pueden ofrecer precios m?s baratos. ? ?El sitio web www.goodrx.com tiene cupones para medicamentos de Office manager. Los precios aqu? no tienen en cuenta lo que podr?a costar con la ayuda del seguro (puede ser m?s barato con su seguro), pero el sitio web puede darle el precio si no utiliz? ning?n seguro.  ?- Puede imprimir el cup?n correspondiente y llevarlo con su receta a la farmacia.  ?- Tambi?n puede pasar por nuestra oficina durante el horario de atenci?n regular y recoger una tarjeta de cupones de GoodRx.  ?- Si necesita que su receta se env?e electr?nicamente a Chiropodist, informe a nuestra oficina a trav?s de MyChart de [place] o por tel?fono llamando al [phone] y presione la opci?n 4.  ?

## 2021-05-30 NOTE — Progress Notes (Signed)
? ?  New Patient Visit ? ?Subjective  ?Jill Holmes is a 66 y.o. female who presents for the following: Rash (Of arms, legs and back - scratches at night. Has used TMC 0.1% cream but it doesn't make it away completely). ? ?The following portions of the chart were reviewed this encounter and updated as appropriate:  ? Tobacco  Allergies  Meds  Problems  Med Hx  Surg Hx  Fam Hx   ?  ?Review of Systems:  No other skin or systemic complaints except as noted in HPI or Assessment and Plan. ? ?Objective  ?Well appearing patient in no apparent distress; mood and affect are within normal limits. ? ?A focused examination was performed including trunk, arms, legs. Relevant physical exam findings are noted in the Assessment and Plan. ? ?Pink eruption with crusts of arms, back and legs ? ? ? ? ? ? ? ? ? ? ? ? ? ?Assessment & Plan  ?Urticaria -severe and generalized ?With Pruritus and dermatographia and excoriations ? ?Start Allegra 180 mg 1 po qam - start with 1 tablet daily for at least 3 days and then increase to 2 tablets daily if not improving. May increase 1 additional tablet every 3 days up to 4 tablets daily as needed. ? ?Continue benadryl qhs as needed for itch. ? ?Continue TMC 0.1% cream qd - avoid face, groin, underarms ? ?Urticaria or hives is a pink to red patchy whelp- like rash of the skin that typically itches and it is the result of histamine release in the skin.   ?Hives may have multiple causes including stress, medications, infections, and systemic illness.  Sometimes there is a family history of chronic urticaria.   ?"Physical urticarias" may be caused by heat, sun, cold, vibration.   ?Insect bites can cause "papular urticaria". ?It is often difficult to find the cause of generalized hives.  Statistically, 70% of the time a cause of generalized hives is not found.  Sometimes hives can spontaneously resolve. ?Other times hives can persist and when it does, and no cause is found, and it has been at  least 6 weeks since started, it is called "chronic idiopathic urticaria". ?Antihistamines are the mainstay for treatment.  In severe cases Xolair injections may be used. ? ?triamcinolone cream (KENALOG) 0.1 % ?Apply 1 application. topically daily. Avoid face, groin, underarms ? ?Return in about 3 weeks (around 06/20/2021) for Follow up. ? ?I, Ashok Cordia, CMA, am acting as scribe for Sarina Ser, MD . ?Documentation: I have reviewed the above documentation for accuracy and completeness, and I agree with the above. ? ?Sarina Ser, MD ? ?

## 2021-05-31 ENCOUNTER — Encounter: Payer: Self-pay | Admitting: Dermatology

## 2021-06-08 ENCOUNTER — Ambulatory Visit
Admission: RE | Admit: 2021-06-08 | Discharge: 2021-06-08 | Disposition: A | Discharge status: Home / Self Care | Payer: BC Managed Care – PPO | Source: Ambulatory Visit | Attending: Internal Medicine | Admitting: Internal Medicine

## 2021-06-08 DIAGNOSIS — R921 Mammographic calcification found on diagnostic imaging of breast: Secondary | ICD-10-CM | POA: Diagnosis present

## 2021-06-14 ENCOUNTER — Ambulatory Visit: Payer: BC Managed Care – PPO | Admitting: Podiatry

## 2021-06-14 DIAGNOSIS — M778 Other enthesopathies, not elsewhere classified: Secondary | ICD-10-CM | POA: Diagnosis not present

## 2021-06-14 DIAGNOSIS — M19071 Primary osteoarthritis, right ankle and foot: Secondary | ICD-10-CM | POA: Diagnosis not present

## 2021-06-14 DIAGNOSIS — M7751 Other enthesopathy of right foot: Secondary | ICD-10-CM | POA: Diagnosis not present

## 2021-06-14 MED ORDER — BETAMETHASONE SOD PHOS & ACET 6 (3-3) MG/ML IJ SUSP: 3.0000 mg | Freq: Once | INTRAMUSCULAR | Status: DC

## 2021-06-14 NOTE — Progress Notes (Signed)
? ?  HPI: 66 year old female presents to the office today for evaluation of right foot pain.  The patient states that the orthotics helped significantly that she received last year.  She is requesting new orthotics to be made.  She has had a recent flareup of pain and tenderness to the right foot.  Presents for further treatment and evaluation ? ?Past Medical History:  ?Diagnosis Date  ? Cancer [facility])   ? thyroid ca  ? Diabetes mellitus without complication ([place])   ? Diverticulitis   ? Hypertension   ? Thyroid disease   ? ? ?  ?Physical Exam: ?General: The patient is alert and oriented x3 in no acute distress. ? ?Dermatology: Skin is warm, dry and supple bilateral lower extremities. Negative for open lesions or macerations. ? ?Vascular: Neurovascular status intact. Palpable pedal pulses bilaterally. No edema or erythema noted. Capillary refill within normal limits. ? ?Neurological: Epicritic and protective threshold grossly intact bilaterally.  ? ?Musculoskeletal Exam: There is some tenderness on palpation and range of motion to the second MTPJ right foot as well as the midfoot of the right lower extremity. Range of motion within normal limits to all pedal and ankle joints bilateral. Muscle strength 5/5 in all groups bilateral.  ? ? ?Assessment: ?1. Second MPJ capsulitis right foot ?2. Midfoot capsulitis right foot ?3.  DJD right foot ? ?Plan of Care:  ?1. Patient was evaluated.  ?2. Injection of 0.5 mL Celestone Soluspan injected into the Lisfranc joint/midtarsal joint and second MTP right foot ?3.  Refill prescription for meloxicam 15 mg daily as needed.  ?4. Continue taking Gabapentin as directed by PCP.  ?5.  Appointment with our Pedorthist for new custom molded orthotics.  In the meantime continue wearing old orthotics ?6.  Return to clinic as needed ? ?Systems analyst at [employer].  ? ? ?Edrick Kins, DPM ?[place] ? ?Dr. Edrick Kins, DPM  ?  ?[address]                                         ?[place],  [address]                ?Office [phone]  ?Fax [phone] ? ? ? ? ? ?

## 2021-06-15 ENCOUNTER — Other Ambulatory Visit: Payer: Self-pay | Admitting: Internal Medicine

## 2021-06-17 ENCOUNTER — Other Ambulatory Visit: Payer: Self-pay

## 2021-06-17 MED ORDER — METFORMIN HCL 500 MG PO TABS: 500.0000 mg | ORAL_TABLET | Freq: Two times a day (BID) | ORAL | 3 refills | Status: DC

## 2021-06-20 ENCOUNTER — Ambulatory Visit: Payer: BC Managed Care – PPO | Admitting: Dermatology

## 2021-06-20 DIAGNOSIS — L503 Dermatographic urticaria: Secondary | ICD-10-CM

## 2021-06-20 DIAGNOSIS — L508 Other urticaria: Secondary | ICD-10-CM | POA: Diagnosis not present

## 2021-06-20 DIAGNOSIS — L509 Urticaria, unspecified: Secondary | ICD-10-CM

## 2021-06-20 NOTE — Progress Notes (Signed)
? ?  Follow-Up Visit ?  ?Subjective  ?Jill Holmes is a 66 y.o. female who presents for the following: Urticaria (3 week recheck. Flared on neck last week, swelled/itched. Increased Allegra to 2 a day and seemed to improve. Rash on arms comes and goes. Buttocks is clearing. Legs are still flared badly. Has been using Triamcinolone cream as directed). ? ?The following portions of the chart were reviewed this encounter and updated as appropriate:  Tobacco  Allergies  Meds  Problems  Med Hx  Surg Hx  Fam Hx   ?  ?Review of Systems: No other skin or systemic complaints except as noted in HPI or Assessment and Plan. ? ?Objective  ?Well appearing patient in no apparent distress; mood and affect are within normal limits. ? ?A focused examination was performed including face, arms, legs, buttocks. Relevant physical exam findings are noted in the Assessment and Plan. ? ?Left Knee - Anterior ?Scattered erythematous papules, some excoriated, in various stages of healing.  ? ? ?Assessment & Plan  ?Urticaria ?Left Knee - Anterior ?severe and generalized ?With Pruritus and dermatographia and excoriations ?  ?Increase Allegra 180 mg to 3 tablets daily, may need to increase to 4 if not controlled. ?  ?Continue benadryl qhs as needed for itch. ?  ?Continue TMC 0.1% cream qd - avoid face, groin, underarms ?  ?Urticaria or hives is a pink to red patchy whelp- like rash of the skin that typically itches and it is the result of histamine release in the skin.   ?Hives may have multiple causes including stress, medications, infections, and systemic illness.  Sometimes there is a family history of chronic urticaria.   ?"Physical urticarias" may be caused by heat, sun, cold, vibration.   ?Insect bites can cause "papular urticaria". ?It is often difficult to find the cause of generalized hives.  Statistically, 70% of the time a cause of generalized hives is not found.  Sometimes hives can spontaneously resolve. ?Other times hives can  persist and when it does, and no cause is found, and it has been at least 6 weeks since started, it is called "chronic idiopathic urticaria". ?Antihistamines are the mainstay for treatment.  In severe cases Xolair injections may be used. ? ?Call in 2 weeks with update. ? ?Reviewed labs from 08/2020. CBC, CMP, HFP WNL. ? ?Recommend TSH screening with PCP at next appointment.  ? ?Related Procedures ?Alpha-Gal Panel ? ?Related Medications ?triamcinolone cream (KENALOG) 0.1 % ?Apply 1 application. topically daily. Avoid face, groin, underarms ? ?Return for urticaria follow up in 3 months. ? ?I, Emelia Salisbury, CMA, am acting as scribe for Sarina Ser, MD. ?Documentation: I have reviewed the above documentation for accuracy and completeness, and I agree with the above. ? ?Sarina Ser, MD ? ? ?

## 2021-06-20 NOTE — Patient Instructions (Addendum)
Increase Allegra 180 mg to 3 tablets daily, may need to increase to 4 if not controlled. ?  ?Continue benadryl qhs as needed for itch. ?  ?Continue TMC 0.1% cream qd - avoid face, groin, underarms ? ?Call in 2 weeks with update. ? ?Topical steroids (such as triamcinolone, fluocinolone, fluocinonide, mometasone, clobetasol, halobetasol, betamethasone, hydrocortisone) can cause thinning and lightening of the skin if they are used for too long in the same area. Your physician has selected the right strength medicine for your problem and area affected on the body. Please use your medication only as directed by your physician to prevent side effects.   ? ? ?If You Need Anything After Your Visit ? ?If you have any questions or concerns for your doctor, please call our main line at [phone] and press option 4 to reach your doctor's medical assistant. If no one answers, please leave a voicemail as directed and we will return your call as soon as possible. Messages left after 4 pm will be answered the following business day.  ? ?You may also send [place] a message via MyChart. We typically respond to MyChart messages within 1-2 business days. ? ?For prescription refills, please ask your pharmacy to contact our office. Our fax number is [phone]. ? ?If you have an urgent issue when the clinic is closed that cannot wait until the next business day, you can page your doctor at the number below.   ? ?Please note that while we do our best to be available for urgent issues outside of office hours, we are not available 24/7.  ? ?If you have an urgent issue and are unable to reach [place], you may choose to seek medical care at your doctor's office, retail clinic, urgent care center, or emergency room. ? ?If you have a medical emergency, please immediately call 911 or go to the emergency department. ? ?Pager Numbers ? ?- Dr. Nehemiah Massed: [phone] ? ?- Dr. Laurence Ferrari: [phone] ? ?- Dr. Nicole Kindred: [phone] ? ?In the event of inclement  weather, please call our main line at [phone] for an update on the status of any delays or closures. ? ?Dermatology Medication Tips: ?Please keep the boxes that topical medications come in in order to help keep track of the instructions about where and how to use these. Pharmacies typically print the medication instructions only on the boxes and not directly on the medication tubes.  ? ?If your medication is too expensive, please contact our office at [phone] option 4 or send [place] a message through [place].  ? ?We are unable to tell what your co-pay for medications will be in advance as this is different depending on your insurance coverage. However, we may be able to find a substitute medication at lower cost or fill out paperwork to get insurance to cover a needed medication.  ? ?If a prior authorization is required to get your medication covered by your insurance company, please allow [place] 1-2 business days to complete this process. ? ?Drug prices often vary depending on where the prescription is filled and some pharmacies may offer cheaper prices. ? ?The website www.goodrx.com contains coupons for medications through different pharmacies. The prices here do not account for what the cost may be with help from insurance (it may be cheaper with your insurance), but the website can give you the price if you did not use any insurance.  ?- You can print the associated coupon and take it with your prescription to the pharmacy.  ?- You  may also stop by our office during regular business hours and pick up a GoodRx coupon card.  ?- If you need your prescription sent electronically to a different pharmacy, notify our office through [facility] or by phone at [phone] option 4. ? ? ? ? ?Si Usted Necesita Algo Despu?s de Su Visita ? ?Tambi?n puede enviarnos un mensaje a trav?s de MyChart. Por lo general respondemos a los mensajes de MyChart en el transcurso de 1 a 2 d?as h?biles. ? ?Para renovar recetas,  por favor pida a su farmacia que se ponga en contacto con nuestra oficina. Nuestro n?mero de fax es el [phone]. ? ?Si tiene un asunto urgente cuando la cl?nica est? cerrada y que no puede esperar hasta el siguiente d?a h?bil, puede llamar/localizar a su doctor(a) al n?mero que aparece a continuaci?n.  ? ?Por favor, tenga en cuenta que aunque hacemos todo lo posible para estar disponibles para asuntos urgentes fuera del horario de oficina, no estamos disponibles las 24 horas del d?a, los 7 d?as de la semana.  ? ?Si tiene un problema urgente y no puede comunicarse con nosotros, puede optar por buscar atenci?n m?dica  en el consultorio de su doctor(a), en una cl?nica privada, en un centro de atenci?n urgente o en una sala de emergencias. ? ?Si tiene Engineer, maintenance (IT) m?dica, por favor llame inmediatamente al 911 o vaya a la sala de emergencias. ? ?N?meros de b?per ? ?- Dr. Nehemiah Massed: [phone] ? ?- Dra. Moye: [phone] ? ?- Dra. Nicole Kindred: [phone] ? ?En caso de inclemencias del tiempo, por favor llame a nuestra l?nea principal al [phone] para una actualizaci?n sobre el estado de cualquier retraso o cierre. ? ?Consejos para la medicaci?n en dermatolog?a: ?Por favor, guarde las cajas en las que vienen los medicamentos de uso t?pico para ayudarle a seguir las instrucciones sobre d?nde y c?mo usarlos. Las farmacias generalmente imprimen las instrucciones del medicamento s?lo en las cajas y no directamente en los tubos del [place].  ? ?Si su medicamento es muy caro, por favor, p?ngase en contacto con Zigmund Daniel llamando al [phone] y presione la opci?n 4 o env?enos un mensaje a trav?s de MyChart.  ? ?No podemos decirle cu?l ser? su copago por los medicamentos por adelantado ya que esto es diferente dependiendo de la cobertura de su seguro. Sin embargo, es posible que podamos encontrar un medicamento sustituto a Electrical engineer un formulario para que el seguro cubra el medicamento que se  considera necesario.  ? ?Si se requiere Ardelia Mems autorizaci?n previa para que su compa??a de seguros [place] su medicamento, por favor perm?tanos de 1 a 2 d?as h?biles para completar este proceso. ? ?Los precios de los medicamentos var?an con frecuencia dependiendo del Environmental consultant de d?nde se surte la receta y alguna farmacias pueden ofrecer precios m?s baratos. ? ?El sitio web www.goodrx.com tiene cupones para medicamentos de Airline pilot. Los precios aqu? no tienen en cuenta lo que podr?a costar con la ayuda del seguro (puede ser m?s barato con su seguro), pero el sitio web puede darle el precio si no utiliz? ning?n seguro.  ?- Puede imprimir el cup?n correspondiente y llevarlo con su receta a la farmacia.  ?- Tambi?n puede pasar por nuestra oficina durante el horario de atenci?n regular y recoger una tarjeta de cupones de GoodRx.  ?- Si necesita que su receta se env?e electr?nicamente a Chiropodist, informe a nuestra oficina a trav?s de MyChart de [place] o por tel?fono llamando al [phone] y presione  la opci?n 4.  ?

## 2021-06-25 LAB — ALPHA-GAL PANEL
Allergen Lamb IgE: <0.1 kU/L
Beef IgE: <0.1 kU/L
IgE (Immunoglobulin E), Serum: 198 IU/mL (ref 6–495)
O215-IgE Alpha-Gal: <0.1 kU/L
Pork IgE: <0.1 kU/L

## 2021-06-27 ENCOUNTER — Telehealth: Payer: Self-pay

## 2021-06-27 ENCOUNTER — Other Ambulatory Visit: Payer: Self-pay | Admitting: Dermatology

## 2021-06-27 ENCOUNTER — Ambulatory Visit (INDEPENDENT_AMBULATORY_CARE_PROVIDER_SITE_OTHER): Payer: BC Managed Care – PPO

## 2021-06-27 ENCOUNTER — Other Ambulatory Visit: Payer: Self-pay | Admitting: Internal Medicine

## 2021-06-27 DIAGNOSIS — M778 Other enthesopathies, not elsewhere classified: Secondary | ICD-10-CM

## 2021-06-27 DIAGNOSIS — L509 Urticaria, unspecified: Secondary | ICD-10-CM

## 2021-06-27 NOTE — Progress Notes (Signed)
SITUATION ?Reason for Consult: Evaluation for Bilateral Custom Foot Orthoses ?Patient / Caregiver Report: Patient is ready for foot orthotics ? ?OBJECTIVE DATA: ?Patient History / Diagnosis:  ?  ICD-10-CM   ?1. Capsulitis of right foot  M77.8   ?  ?2. Capsulitis of left foot  M77.8   ?  ? ? ?Current or Previous Devices:   Current user ? ?Foot Examination: ?Skin presentation:   Intact ?Ulcers & Callousing:   None ?Toe / Foot Deformities:  None ?Weight Bearing Presentation:  Rectus ?Sensation:    Intact ? ?Shoe Size:    11W ? ?ORTHOTIC RECOMMENDATION ?Recommended Device: 1x pair of custom functional foot orthotics ? ?GOALS OF ORTHOSES ?- Reduce Pain ?- Prevent Foot Deformity ?- Prevent Progression of Further Foot Deformity ?- Relieve Pressure ?- Improve the Overall Biomechanical Function of the Foot and Lower Extremity. ? ?ACTIONS PERFORMED ?Potential out of pocket cost was communicated to patient. Patient understood and consent to casting. Patient was casted for Foot Orthoses via crush box. Procedure was explained and patient tolerated procedure well. Casts were shipped to central fabrication. All questions were answered and concerns addressed. ? ?PLAN ?Patient is to be called for fitting when devices are ready.  ? ? ?

## 2021-06-27 NOTE — Telephone Encounter (Signed)
Discussed labs results with pt  ?

## 2021-06-27 NOTE — Telephone Encounter (Signed)
Left pt msg to call for lab results/sh 

## 2021-06-27 NOTE — Telephone Encounter (Signed)
-----   Message from Ralene Bathe, MD sent at 06/26/2021  6:41 PM EDT ----- ?06/21/21 blood work showed: ?Alpha-Gal Testing Is ALL NEGATIVE = NORMAL ?IgE levels are normal ?No evidence of meat allergy. ? ?Continue current recommended treatment for Hives. ?

## 2021-06-28 ENCOUNTER — Encounter: Payer: Self-pay | Admitting: Dermatology

## 2021-07-15 ENCOUNTER — Other Ambulatory Visit: Payer: Self-pay | Admitting: Internal Medicine

## 2021-08-02 ENCOUNTER — Ambulatory Visit: Payer: BC Managed Care – PPO | Admitting: Internal Medicine

## 2021-08-02 ENCOUNTER — Encounter: Payer: Self-pay | Admitting: Internal Medicine

## 2021-08-02 VITALS — BP 133/79 | HR 71 | Ht 66.0 in | Wt 227.7 lb

## 2021-08-02 DIAGNOSIS — E119 Type 2 diabetes mellitus without complications: Secondary | ICD-10-CM | POA: Diagnosis not present

## 2021-08-02 DIAGNOSIS — E782 Mixed hyperlipidemia: Secondary | ICD-10-CM

## 2021-08-02 DIAGNOSIS — E669 Obesity, unspecified: Secondary | ICD-10-CM

## 2021-08-02 DIAGNOSIS — E7849 Other hyperlipidemia: Secondary | ICD-10-CM

## 2021-08-02 DIAGNOSIS — I10 Essential (primary) hypertension: Secondary | ICD-10-CM

## 2021-08-02 LAB — GLUCOSE, POCT (MANUAL RESULT ENTRY): POC Glucose: 273 mg/dl — AB (ref 70–99)

## 2021-08-02 MED ORDER — GLIMEPIRIDE 4 MG PO TABS
4.0000 mg | ORAL_TABLET | Freq: Every day | ORAL | 3 refills | Status: DC
Start: 2021-08-02 — End: 2021-11-29

## 2021-08-02 NOTE — Progress Notes (Signed)
Established Patient Office Visit  Subjective:  Patient ID: Jill Holmes, female    DOB: [birth year]  Age: 66 y.o. MRN: [id number]  CC:  Chief Complaint  Patient presents with   Diabetes    Diabetes    [name] presents for check up  Past Medical History:  Diagnosis Date   Cancer [facility])    thyroid ca   Diabetes mellitus without complication ([place])    Diverticulitis    Hypertension    Thyroid disease     Past Surgical History:  Procedure Laterality Date   ABDOMINAL HYSTERECTOMY     COLONOSCOPY WITH PROPOFOL N/A 02/07/2019   Procedure: COLONOSCOPY WITH PROPOFOL;  Surgeon: Lucilla Lame, MD;  Location: [facility] ENDOSCOPY;  Service: Endoscopy;  Laterality: N/A;    Family History  Problem Relation Age of Onset   Breast cancer Mother 73    Social History   Socioeconomic History   Marital status: Married    Spouse name: Not on file   Number of children: Not on file   Years of education: Not on file   Highest education level: Not on file  Occupational History   Not on file  Tobacco Use   Smoking status: Never   Smokeless tobacco: Never  Substance and Sexual Activity   Alcohol use: No   Drug use: No   Sexual activity: Yes    Birth control/protection: None  Other Topics Concern   Not on file  Social History Narrative   Not on file   Social Determinants of Health   Financial Resource Strain: Not on file  Food Insecurity: Not on file  Transportation Needs: Not on file  Physical Activity: Not on file  Stress: Not on file  Social Connections: Not on file  Intimate Partner Violence: Not on file     Current Outpatient Medications:    amLODipine (NORVASC) 10 MG tablet, TAKE ONE TABLET EVERY DAY, Disp: 30 tablet, Rfl: 6   Blood Glucose Monitoring Suppl ([place]) w/Device KIT, See admin instructions., Disp: , Rfl:    clotrimazole-betamethasone (LOTRISONE) cream, APPLY TOPICALLY TWICE DAILY AS DIRECTED, Disp: 30 g, Rfl: 6   docusate sodium  (COLACE) 100 MG capsule, Take 1 capsule (100 mg total) by mouth 2 (two) times daily., Disp: 10 capsule, Rfl: 0   EPINEPHrine 0.3 mg/0.3 mL IJ SOAJ injection, Inject 0.3 mg into the skin., Disp: , Rfl:    gabapentin (NEURONTIN) 100 MG capsule, TAKE TWO CAPSULES THREE TIMES A DAY, Disp: 540 capsule, Rfl: 1   glucose blood test strip, , Disp: , Rfl:    levothyroxine (SYNTHROID) 150 MCG tablet, TAKE 1 TABLET BY MOUTH DAILY, Disp: 90 tablet, Rfl: 3   lovastatin (MEVACOR) 20 MG tablet, TAKE 1 TABLET BY MOUTH DAILY, Disp: 90 tablet, Rfl: 0   meloxicam (MOBIC) 15 MG tablet, TAKE 1 TABLET BY MOUTH DAILY, Disp: 90 tablet, Rfl: 2   metFORMIN (GLUCOPHAGE) 500 MG tablet, Take 1 tablet (500 mg total) by mouth 2 (two) times daily with a meal., Disp: 180 tablet, Rfl: 3   metFORMIN (GLUCOPHAGE-XR) 500 MG 24 hr tablet, TAKE 1 TABLET BY MOUTH 3 TIMES DAILY, Disp: 90 tablet, Rfl: 1   metoprolol succinate (TOPROL-XL) 100 MG 24 hr tablet, TAKE 1 TABLET BY MOUTH DAILY, Disp: 90 tablet, Rfl: 3   metoprolol succinate (TOPROL-XL) 50 MG 24 hr tablet, metoprolol succinate ER 50 mg tablet,extended release 24 hr, Disp: , Rfl:    nystatin cream (MYCOSTATIN), APPLY TO AFFECTED  AREAS 3 TIMES DAILY AS NEEDED, Disp: , Rfl:    ondansetron (ZOFRAN) 4 MG tablet, Take 1 tablet (4 mg total) by mouth daily as needed for nausea or vomiting., Disp: 20 tablet, Rfl: 1   polyethylene glycol powder (GLYCOLAX/MIRALAX) 17 GM/SCOOP powder, Take 17 g by mouth 2 (two) times daily as needed., Disp: 3350 g, Rfl: 1   Semaglutide (RYBELSUS) 3 MG TABS, Take by mouth., Disp: , Rfl:    triamcinolone cream (KENALOG) 0.1 %, APPLY TOPICALLY EVERY DAY. AVOID FACE, GROIN, UNDERARMS., Disp: 453.6 g, Rfl: 0  Current Facility-Administered Medications:    betamethasone acetate-betamethasone sodium phosphate (CELESTONE) injection 3 mg, 3 mg, Intramuscular, Once, Evans, Brent M, DPM   betamethasone acetate-betamethasone sodium phosphate (CELESTONE) injection 3 mg, 3  mg, Intramuscular, Once, Evans, Brent M, DPM   betamethasone acetate-betamethasone sodium phosphate (CELESTONE) injection 3 mg, 3 mg, Intra-articular, Once, Evans, Brent M, DPM   betamethasone acetate-betamethasone sodium phosphate (CELESTONE) injection 3 mg, 3 mg, Intra-articular, Once, Evans, Brent M, DPM   Allergies  Allergen Reactions   Elemental Sulfur Anaphylaxis   Enalapril Anaphylaxis   Lisinopril    Sulfa Antibiotics     ROS Review of Systems  Constitutional: Negative.   HENT: Negative.    Eyes: Negative.   Respiratory: Negative.    Cardiovascular: Negative.   Gastrointestinal: Negative.   Endocrine: Negative.   Genitourinary: Negative.   Musculoskeletal: Negative.   Skin: Negative.   Allergic/Immunologic: Negative.   Neurological: Negative.   Hematological: Negative.   Psychiatric/Behavioral: Negative.    All other systems reviewed and are negative.     Objective:    Physical Exam Vitals reviewed.  Constitutional:      Appearance: Normal appearance.  HENT:     Mouth/Throat:     Mouth: Mucous membranes are moist.  Eyes:     Pupils: Pupils are equal, round, and reactive to light.  Neck:     Vascular: No carotid bruit.  Cardiovascular:     Rate and Rhythm: Normal rate and regular rhythm.     Pulses: Normal pulses.     Heart sounds: Normal heart sounds.  Pulmonary:     Effort: Pulmonary effort is normal.     Breath sounds: Normal breath sounds.  Abdominal:     General: Bowel sounds are normal.     Palpations: Abdomen is soft. There is no hepatomegaly, splenomegaly or mass.     Tenderness: There is no abdominal tenderness.     Hernia: No hernia is present.  Musculoskeletal:        General: No tenderness.     Cervical back: Neck supple.     Right lower leg: No edema.     Left lower leg: No edema.  Skin:    Findings: No rash.  Neurological:     Mental Status: She is alert and oriented to person, place, and time.     Motor: No weakness.   Psychiatric:        Mood and Affect: Mood and affect normal.        Behavior: Behavior normal.     BP 133/79   Pulse 71   Ht '5\' 6"'  (1.676 m)   Wt 227 lb 11.2 oz (103.3 kg)   BMI 36.75 kg/m  Wt Readings from Last 3 Encounters:  08/02/21 227 lb 11.2 oz (103.3 kg)  09/10/20 229 lb (103.9 kg)  08/03/20 233 lb (105.7 kg)     Health Maintenance Due  Topic Date Due   COVID-19  Vaccine (1) Never done   HIV Screening  Never done   Hepatitis C Screening  Never done   TETANUS/TDAP  Never done   Zoster Vaccines- Shingrix (1 of 2) Never done   HEMOGLOBIN A1C  06/08/2021    There are no preventive care reminders to display for this patient.  Lab Results  Component Value Date   TSH 0.66 09/12/2019   Lab Results  Component Value Date   WBC 5.6 09/10/2020   HGB 12.9 09/10/2020   HCT 39.5 09/10/2020   MCV 90.0 09/10/2020   PLT 251 09/10/2020   Lab Results  Component Value Date   NA 140 09/10/2020   K 4.1 09/10/2020   CO2 26 09/10/2020   GLUCOSE 164 (H) 09/10/2020   BUN 14 09/10/2020   CREATININE 0.74 09/10/2020   BILITOT 0.9 09/10/2020   ALKPHOS 48 10/26/2014   AST 24 09/10/2020   ALT 25 09/10/2020   PROT 6.9 09/10/2020   ALBUMIN 4.0 10/26/2014   CALCIUM 9.2 09/10/2020   ANIONGAP 9 10/26/2014   EGFR 91 09/10/2020   Lab Results  Component Value Date   CHOL 166 09/10/2020   Lab Results  Component Value Date   HDL 47 (L) 09/10/2020   Lab Results  Component Value Date   LDLCALC 85 09/10/2020   Lab Results  Component Value Date   TRIG 244 (H) 09/10/2020   Lab Results  Component Value Date   CHOLHDL 3.5 09/10/2020   Lab Results  Component Value Date   HGBA1C 7.6 12/08/2020      Assessment & Plan:   Problem List Items Addressed This Visit       Cardiovascular and Mediastinum   Hypertension    The following hypertensive lifestyle modification were recommended and discussed:  1. Limiting alcohol intake to less than 1 oz/day of ethanol:(24 oz of  beer or 8 oz of wine or 2 oz of 100-proof whiskey). 2. Take baby ASA 81 mg daily. 3. Importance of regular aerobic exercise and losing weight. 4. Reduce dietary saturated fat and cholesterol intake for overall cardiovascular health. 5. Maintaining adequate dietary potassium, calcium, and magnesium intake. 6. Regular monitoring of the blood pressure. 7. Reduce sodium intake to less than 100 mmol/day (less than 2.3 gm of sodium or less than 6 gm of sodium choride)       Relevant Orders   CBC with Differential/Platelet   COMPLETE METABOLIC PANEL WITH GFR     Endocrine   Diabetes mellitus ([place]) - Primary    - The patient's blood sugar is labile on med. - The patient will continue the current treatment regimen.  - I encouraged the patient to regularly check blood sugar.  - I encouraged the patient to monitor diet. I encouraged the patient to eat low-carb and low-sugar to help prevent blood sugar spikes.  - I encouraged the patient to continue following their prescribed treatment plan for diabetes - I informed the patient to get help if blood sugar drops below 47m/dL, or if suddenly have trouble thinking clearly or breathing.  Patient was advised to buy a book on diabetes from a local bookstore or from [place]  Patient should read 2 chapters every day to keep the motivation going, this is in addition to some of the materials we provided them from the office.  There are other resources on the Internet like YouTube and wilkipedia to get an education on the diabetes      Relevant Orders   POCT glucose (manual  entry) (Completed)   Hemoglobin A1c   Microalbumin, urine     Other   Mixed hyperlipidemia    Hypercholesterolemia  I advised the patient to follow Mediterranean diet This diet is rich in fruits vegetables and whole grain, and This diet is also rich in fish and lean meat Patient should also eat a handful of almonds or walnuts daily Recent heart study indicated that average follow-up  on this kind of diet reduces the cardiovascular mortality by 50 to 70%==      Relevant Orders   Lipid panel   Obesity (BMI 30-39.9)    Behavioral modification strategies: increasing lean protein intake, decreasing simple carbohydrates, increasing vegetables, increasing water intake, decreasing eating out, no skipping meals, meal planning and cooking strategies, keeping healthy foods in the home and planning for success.      Relevant Orders   TSH   Other hyperlipidemia    Hypercholesterolemia  I advised the patient to follow Mediterranean diet This diet is rich in fruits vegetables and whole grain, and This diet is also rich in fish and lean meat Patient should also eat a handful of almonds or walnuts daily Recent heart study indicated that average follow-up on this kind of diet reduces the cardiovascular mortality by 50 to 70%==       No orders of the defined types were placed in this encounter.   Follow-up: No follow-ups on file.    Cletis Athens, MD

## 2021-08-02 NOTE — Assessment & Plan Note (Signed)
Hypercholesterolemia  I advised the patient to follow Mediterranean diet This diet is rich in fruits vegetables and whole grain, and This diet is also rich in fish and lean meat Patient should also eat a handful of almonds or walnuts daily Recent heart study indicated that average follow-up on this kind of diet reduces the cardiovascular mortality by 50 to 70%== 

## 2021-08-02 NOTE — Assessment & Plan Note (Signed)

## 2021-08-02 NOTE — Assessment & Plan Note (Signed)
Behavioral modification strategies: increasing lean protein intake, decreasing simple carbohydrates, increasing vegetables, increasing water intake, decreasing eating out, no skipping meals, meal planning and cooking strategies, keeping healthy foods in the home and planning for success. 

## 2021-08-02 NOTE — Assessment & Plan Note (Signed)

## 2021-08-05 LAB — CBC WITH DIFFERENTIAL/PLATELET
Absolute Monocytes: 431 cells/uL (ref 200–950)
Basophils Absolute: 50 cells/uL (ref 0–200)
Basophils Relative: 0.9 %
Eosinophils Absolute: 162 cells/uL (ref 15–500)
Eosinophils Relative: 2.9 %
HCT: 36.9 % (ref 35.0–45.0)
Hemoglobin: 11.5 g/dL — ABNORMAL LOW (ref 11.7–15.5)
Lymphs Abs: 1557 cells/uL (ref 850–3900)
MCH: 25.4 pg — ABNORMAL LOW (ref 27.0–33.0)
MCHC: 31.2 g/dL — ABNORMAL LOW (ref 32.0–36.0)
MCV: 81.5 fL (ref 80.0–100.0)
MPV: 11.1 fL (ref 7.5–12.5)
Monocytes Relative: 7.7 %
Neutro Abs: 3399 cells/uL (ref 1500–7800)
Neutrophils Relative %: 60.7 %
Platelets: 299 10*3/uL (ref 140–400)
RBC: 4.53 10*6/uL (ref 3.80–5.10)
RDW: 14.3 % (ref 11.0–15.0)
Total Lymphocyte: 27.8 %
WBC: 5.6 10*3/uL (ref 3.8–10.8)

## 2021-08-05 LAB — COMPLETE METABOLIC PANEL WITH GFR
AG Ratio: 1.2 (calc) (ref 1.0–2.5)
ALT: 22 U/L (ref 6–29)
AST: 24 U/L (ref 10–35)
Albumin: 4 g/dL (ref 3.6–5.1)
Alkaline phosphatase (APISO): 53 U/L (ref 37–153)
BUN: 14 mg/dL (ref 7–25)
CO2: 26 mmol/L (ref 20–32)
Calcium: 9.4 mg/dL (ref 8.6–10.4)
Chloride: 100 mmol/L (ref 98–110)
Creat: 0.74 mg/dL (ref 0.50–1.05)
Globulin: 3.4 g/dL (calc) (ref 1.9–3.7)
Glucose, Bld: 161 mg/dL — ABNORMAL HIGH (ref 65–99)
Potassium: 4.2 mmol/L (ref 3.5–5.3)
Sodium: 138 mmol/L (ref 135–146)
Total Bilirubin: 0.7 mg/dL (ref 0.2–1.2)
Total Protein: 7.4 g/dL (ref 6.1–8.1)
eGFR: 90 mL/min/{1.73_m2} (ref >=60)

## 2021-08-05 LAB — LIPID PANEL
Cholesterol: 178 mg/dL (ref <200)
HDL: 54 mg/dL (ref >=50)
LDL Cholesterol (Calc): 90 mg/dL (calc)
Non-HDL Cholesterol (Calc): 124 mg/dL (calc) (ref <130)
Total CHOL/HDL Ratio: 3.3 (calc) (ref <5.0)
Triglycerides: 244 mg/dL — ABNORMAL HIGH (ref <150)

## 2021-08-05 LAB — SPECIMEN COMPROMISED

## 2021-08-05 LAB — HEMOGLOBIN A1C
Hgb A1c MFr Bld: 8.8 % of total Hgb — ABNORMAL HIGH (ref <5.7)
Mean Plasma Glucose: 206 mg/dL
eAG (mmol/L): 11.4 mmol/L

## 2021-08-05 LAB — TSH: TSH: 0.83 mIU/L (ref 0.40–4.50)

## 2021-08-05 LAB — MICROALBUMIN, URINE

## 2021-08-06 ENCOUNTER — Other Ambulatory Visit: Payer: Self-pay | Admitting: Podiatry

## 2021-08-08 ENCOUNTER — Ambulatory Visit: Payer: BC Managed Care – PPO

## 2021-08-08 DIAGNOSIS — M778 Other enthesopathies, not elsewhere classified: Secondary | ICD-10-CM

## 2021-08-08 NOTE — Progress Notes (Signed)
SITUATION: Reason for Visit: Fitting and Delivery of Custom Fabricated Foot Orthoses Patient Report: Patient reports comfort and is satisfied with device.  OBJECTIVE DATA: Patient History / Diagnosis:     ICD-10-CM   1. Capsulitis of right foot  M77.8     2. Capsulitis of left foot  M77.8       Provided Device:  Custom Functional Foot Orthotics     [employer]  GOAL OF ORTHOSIS - Improve gait - Decrease energy expenditure - Improve Balance - Provide Triplanar stability of foot complex - Facilitate motion  ACTIONS PERFORMED Patient was fit with foot orthotics trimmed to shoe last. Patient tolerated fittign procedure.   Patient was provided with verbal and written instruction and demonstration regarding donning, doffing, wear, care, proper fit, function, purpose, cleaning, and use of the orthosis and in all related precautions and risks and benefits regarding the orthosis.  Patient was also provided with verbal instruction regarding how to report any failures or malfunctions of the orthosis and necessary follow up care. Patient was also instructed to contact our office regarding any change in status that may affect the function of the orthosis.  Patient demonstrated independence with proper donning, doffing, and fit and verbalized understanding of all instructions.  PLAN: Patient is to follow up in one week or as necessary (PRN). All questions were answered and concerns addressed. Plan of care was discussed with and agreed upon by the patient.

## 2021-08-09 ENCOUNTER — Ambulatory Visit (INDEPENDENT_AMBULATORY_CARE_PROVIDER_SITE_OTHER): Payer: BC Managed Care – PPO | Admitting: Internal Medicine

## 2021-08-09 ENCOUNTER — Encounter: Payer: Self-pay | Admitting: Internal Medicine

## 2021-08-09 ENCOUNTER — Telehealth: Payer: Self-pay

## 2021-08-09 VITALS — BP 138/78 | HR 85 | Ht 66.0 in | Wt 229.1 lb

## 2021-08-09 DIAGNOSIS — E782 Mixed hyperlipidemia: Secondary | ICD-10-CM | POA: Diagnosis not present

## 2021-08-09 DIAGNOSIS — E119 Type 2 diabetes mellitus without complications: Secondary | ICD-10-CM

## 2021-08-09 DIAGNOSIS — R140 Abdominal distension (gaseous): Secondary | ICD-10-CM | POA: Diagnosis not present

## 2021-08-09 DIAGNOSIS — Z1211 Encounter for screening for malignant neoplasm of colon: Secondary | ICD-10-CM

## 2021-08-09 DIAGNOSIS — I10 Essential (primary) hypertension: Secondary | ICD-10-CM | POA: Diagnosis not present

## 2021-08-09 DIAGNOSIS — E669 Obesity, unspecified: Secondary | ICD-10-CM

## 2021-08-09 DIAGNOSIS — K635 Polyp of colon: Secondary | ICD-10-CM

## 2021-08-09 LAB — GLUCOSE, POCT (MANUAL RESULT ENTRY): POC Glucose: 225 mg/dl — AB (ref 70–99)

## 2021-08-09 NOTE — Assessment & Plan Note (Signed)
Patient was given FODMAP diet

## 2021-08-09 NOTE — Addendum Note (Signed)
Addended by: Alois Cliche on: 08/09/2021 03:17 PM   Modules accepted: Orders

## 2021-08-09 NOTE — Progress Notes (Signed)
Established Patient Office Visit  Subjective:  Patient ID: Jill Holmes, female    DOB: 07-15-1955  Age: 66 y.o. MRN: [id number]  CC:  Chief Complaint  Patient presents with   lab results    HPI  Jill Holmes presents for checkup patient is known to have diabetes exogenous obesity and hypertension.  She denies any chest pain or shortness of breath.  She does not smoke does not drink.  Past Medical History:  Diagnosis Date   Cancer ([place])    thyroid ca   Diabetes mellitus without complication ([place])    Diverticulitis    Hypertension    Thyroid disease     Past Surgical History:  Procedure Laterality Date   ABDOMINAL HYSTERECTOMY     COLONOSCOPY WITH PROPOFOL N/A 02/07/2019   Procedure: COLONOSCOPY WITH PROPOFOL;  Surgeon: Lucilla Lame, MD;  Location: [facility] ENDOSCOPY;  Service: Endoscopy;  Laterality: N/A;    Family History  Problem Relation Age of Onset   Breast cancer Mother 81    Social History   Socioeconomic History   Marital status: Married    Spouse name: Not on file   Number of children: Not on file   Years of education: Not on file   Highest education level: Not on file  Occupational History   Not on file  Tobacco Use   Smoking status: Never   Smokeless tobacco: Never  Substance and Sexual Activity   Alcohol use: No   Drug use: No   Sexual activity: Yes    Birth control/protection: None  Other Topics Concern   Not on file  Social History Narrative   Not on file   Social Determinants of Health   Financial Resource Strain: Not on file  Food Insecurity: Not on file  Transportation Needs: Not on file  Physical Activity: Not on file  Stress: Not on file  Social Connections: Not on file  Intimate Partner Violence: Not on file     Current Outpatient Medications:    amLODipine (NORVASC) 10 MG tablet, TAKE ONE TABLET EVERY DAY, Disp: 30 tablet, Rfl: 6   Blood Glucose Monitoring Suppl ([place]) w/Device KIT, See admin  instructions., Disp: , Rfl:    clotrimazole-betamethasone (LOTRISONE) cream, APPLY TOPICALLY TWICE DAILY AS DIRECTED, Disp: 30 g, Rfl: 6   docusate sodium (COLACE) 100 MG capsule, Take 1 capsule (100 mg total) by mouth 2 (two) times daily., Disp: 10 capsule, Rfl: 0   EPINEPHrine 0.3 mg/0.3 mL IJ SOAJ injection, Inject 0.3 mg into the skin., Disp: , Rfl:    gabapentin (NEURONTIN) 100 MG capsule, TAKE TWO CAPSULES THREE TIMES A DAY, Disp: 540 capsule, Rfl: 1   glimepiride (AMARYL) 4 MG tablet, Take 1 tablet (4 mg total) by mouth daily before breakfast., Disp: 30 tablet, Rfl: 3   glucose blood test strip, , Disp: , Rfl:    levothyroxine (SYNTHROID) 150 MCG tablet, TAKE 1 TABLET BY MOUTH DAILY, Disp: 90 tablet, Rfl: 3   lovastatin (MEVACOR) 20 MG tablet, TAKE 1 TABLET BY MOUTH DAILY, Disp: 90 tablet, Rfl: 0   meloxicam (MOBIC) 15 MG tablet, TAKE 1 TABLET BY MOUTH DAILY, Disp: 90 tablet, Rfl: 2   metFORMIN (GLUCOPHAGE) 500 MG tablet, Take 1 tablet (500 mg total) by mouth 2 (two) times daily with a meal., Disp: 180 tablet, Rfl: 3   metFORMIN (GLUCOPHAGE-XR) 500 MG 24 hr tablet, TAKE 1 TABLET BY MOUTH 3 TIMES DAILY, Disp: 90 tablet, Rfl: 1   metoprolol  succinate (TOPROL-XL) 100 MG 24 hr tablet, TAKE 1 TABLET BY MOUTH DAILY, Disp: 90 tablet, Rfl: 3   metoprolol succinate (TOPROL-XL) 50 MG 24 hr tablet, metoprolol succinate ER 50 mg tablet,extended release 24 hr, Disp: , Rfl:    nystatin cream (MYCOSTATIN), APPLY TO AFFECTED AREAS 3 TIMES DAILY AS NEEDED, Disp: , Rfl:    ondansetron (ZOFRAN) 4 MG tablet, Take 1 tablet (4 mg total) by mouth daily as needed for nausea or vomiting., Disp: 20 tablet, Rfl: 1   polyethylene glycol powder (GLYCOLAX/MIRALAX) 17 GM/SCOOP powder, Take 17 g by mouth 2 (two) times daily as needed., Disp: 3350 g, Rfl: 1   Semaglutide (RYBELSUS) 3 MG TABS, Take by mouth., Disp: , Rfl:    triamcinolone cream (KENALOG) 0.1 %, APPLY TOPICALLY EVERY DAY. AVOID FACE, GROIN, UNDERARMS., Disp:  453.6 g, Rfl: 0  Current Facility-Administered Medications:    betamethasone acetate-betamethasone sodium phosphate (CELESTONE) injection 3 mg, 3 mg, Intramuscular, Once, Evans, Brent M, DPM   betamethasone acetate-betamethasone sodium phosphate (CELESTONE) injection 3 mg, 3 mg, Intramuscular, Once, Evans, Brent M, DPM   betamethasone acetate-betamethasone sodium phosphate (CELESTONE) injection 3 mg, 3 mg, Intra-articular, Once, Evans, Brent M, DPM   betamethasone acetate-betamethasone sodium phosphate (CELESTONE) injection 3 mg, 3 mg, Intra-articular, Once, Evans, Brent M, DPM   Allergies  Allergen Reactions   Elemental Sulfur Anaphylaxis   Enalapril Anaphylaxis   Lisinopril    Sulfa Antibiotics     ROS Review of Systems  Constitutional: Negative.   HENT: Negative.    Eyes: Negative.   Respiratory: Negative.    Cardiovascular: Negative.   Gastrointestinal: Negative.   Endocrine: Negative.   Genitourinary: Negative.   Musculoskeletal: Negative.   Skin: Negative.   Allergic/Immunologic: Negative.   Neurological: Negative.   Hematological: Negative.   Psychiatric/Behavioral: Negative.    All other systems reviewed and are negative.     Objective:    Physical Exam Vitals reviewed.  Constitutional:      Appearance: Normal appearance. She is obese.  HENT:     Mouth/Throat:     Mouth: Mucous membranes are moist.  Eyes:     Pupils: Pupils are equal, round, and reactive to light.  Neck:     Vascular: No carotid bruit.  Cardiovascular:     Rate and Rhythm: Normal rate and regular rhythm.     Pulses: Normal pulses.     Heart sounds: Normal heart sounds.  Pulmonary:     Effort: Pulmonary effort is normal.     Breath sounds: Normal breath sounds.  Abdominal:     General: Bowel sounds are normal.     Palpations: Abdomen is soft. There is no hepatomegaly, splenomegaly or mass.     Tenderness: There is no abdominal tenderness.     Hernia: No hernia is present.   Musculoskeletal:        General: No tenderness.     Cervical back: Neck supple.     Right lower leg: No edema.     Left lower leg: No edema.  Skin:    Findings: No rash.  Neurological:     Mental Status: She is alert and oriented to person, place, and time.     Motor: No weakness.  Psychiatric:        Mood and Affect: Mood and affect normal.        Behavior: Behavior normal.     BP 138/78   Pulse 85   Ht '5\' 6"'  (1.676 m)   Wt  229 lb 1.6 oz (103.9 kg)   BMI 36.98 kg/m  Wt Readings from Last 3 Encounters:  08/09/21 229 lb 1.6 oz (103.9 kg)  08/02/21 227 lb 11.2 oz (103.3 kg)  09/10/20 229 lb (103.9 kg)     Health Maintenance Due  Topic Date Due   COVID-19 Vaccine (1) Never done   HIV Screening  Never done   Hepatitis C Screening  Never done   TETANUS/TDAP  Never done   Zoster Vaccines- Shingrix (1 of 2) Never done    There are no preventive care reminders to display for this patient.  Lab Results  Component Value Date   TSH 0.83 08/02/2021   Lab Results  Component Value Date   WBC 5.6 08/02/2021   HGB 11.5 (L) 08/02/2021   HCT 36.9 08/02/2021   MCV 81.5 08/02/2021   PLT 299 08/02/2021   Lab Results  Component Value Date   NA 138 08/02/2021   K 4.2 08/02/2021   CO2 26 08/02/2021   GLUCOSE 161 (H) 08/02/2021   BUN 14 08/02/2021   CREATININE 0.74 08/02/2021   BILITOT 0.7 08/02/2021   ALKPHOS 48 10/26/2014   AST 24 08/02/2021   ALT 22 08/02/2021   PROT 7.4 08/02/2021   ALBUMIN 4.0 10/26/2014   CALCIUM 9.4 08/02/2021   ANIONGAP 9 10/26/2014   EGFR 90 08/02/2021   Lab Results  Component Value Date   CHOL 178 08/02/2021   Lab Results  Component Value Date   HDL 54 08/02/2021   Lab Results  Component Value Date   LDLCALC 90 08/02/2021   Lab Results  Component Value Date   TRIG 244 (H) 08/02/2021   Lab Results  Component Value Date   CHOLHDL 3.3 08/02/2021   Lab Results  Component Value Date   HGBA1C 8.8 (H) 08/02/2021       Assessment & Plan:   Problem List Items Addressed This Visit       Cardiovascular and Mediastinum   Hypertension    The following hypertensive lifestyle modification were recommended and discussed:  1. Limiting alcohol intake to less than 1 oz/day of ethanol:(24 oz of beer or 8 oz of wine or 2 oz of 100-proof whiskey). 2. Take baby ASA 81 mg daily. 3. Importance of regular aerobic exercise and losing weight. 4. Reduce dietary saturated fat and cholesterol intake for overall cardiovascular health. 5. Maintaining adequate dietary potassium, calcium, and magnesium intake. 6. Regular monitoring of the blood pressure. 7. Reduce sodium intake to less than 100 mmol/day (less than 2.3 gm of sodium or less than 6 gm of sodium choride)         Digestive   Polyp of descending colon    Patient is anemic will refer to GI for evaluation        Endocrine   Diabetes mellitus ([place]) - Primary    - The patient's blood sugar is labile on med. - The patient will continue the current treatment regimen.  - I encouraged the patient to regularly check blood sugar.  - I encouraged the patient to monitor diet. I encouraged the patient to eat low-carb and low-sugar to help prevent blood sugar spikes.  - I encouraged the patient to continue following their prescribed treatment plan for diabetes - I informed the patient to get help if blood sugar drops below 66m/dL, or if suddenly have trouble thinking clearly or breathing.  Patient was advised to buy a book on diabetes from a local bookstore or from [place]  Patient should read 2 chapters every day to keep the motivation going, this is in addition to some of the materials we provided them from the office.  There are other resources on the Internet like YouTube and [employer]kipedia to get an education on the diabetes      Relevant Orders   POCT glucose (manual entry) (Completed)     Other   Special screening for malignant neoplasms, colon    Patient will be  scheduled for colonoscopy      Mixed hyperlipidemia    Hypercholesterolemia  I advised the patient to follow Mediterranean diet This diet is rich in fruits vegetables and whole grain, and This diet is also rich in fish and lean meat Patient should also eat a handful of almonds or walnuts daily Recent heart study indicated that average follow-up on this kind of diet reduces the cardiovascular mortality by 50 to 70%==      Obesity (BMI 30-39.9)    - I encouraged the patient to lose weight.  - I educated them on making healthy dietary choices including eating more fruits and vegetables and less fried foods. - I encouraged the patient to exercise more, and educated on the benefits of exercise including weight loss, diabetes prevention, and hypertension prevention.   Dietary counseling with a registered dietician  Referral to a weight management support group (e.g. Weight Watchers, Overeaters Anonymous)  If your BMI is greater than 29 or you have gained more than 15 pounds you should work on weight loss.  Attend a healthy cooking class       Abdominal distension (gaseous)    Patient was given FODMAP diet       No orders of the defined types were placed in this encounter.   Follow-up: No follow-ups on file.    Cletis Athens, MD

## 2021-08-09 NOTE — Assessment & Plan Note (Signed)
Patient is anemic will refer to GI for evaluation

## 2021-08-09 NOTE — Assessment & Plan Note (Signed)

## 2021-08-09 NOTE — Assessment & Plan Note (Signed)

## 2021-08-09 NOTE — Assessment & Plan Note (Signed)
Patient will be scheduled for colonoscopy

## 2021-08-09 NOTE — Telephone Encounter (Signed)
CALLED PATIENT NO ANSWER LEFT [employer] FOR A CALL BACK ? ?

## 2021-08-09 NOTE — Assessment & Plan Note (Signed)
Hypercholesterolemia  I advised the patient to follow Mediterranean diet This diet is rich in fruits vegetables and whole grain, and This diet is also rich in fish and lean meat Patient should also eat a handful of almonds or walnuts daily Recent heart study indicated that average follow-up on this kind of diet reduces the cardiovascular mortality by 50 to 70%== 

## 2021-08-09 NOTE — Assessment & Plan Note (Signed)

## 2021-08-10 ENCOUNTER — Telehealth: Payer: Self-pay

## 2021-08-10 NOTE — Telephone Encounter (Signed)
CALLED PATIENT NO ANSWER LEFT [employer] FOR A CALL BACK ? ?

## 2021-08-11 ENCOUNTER — Telehealth: Payer: Self-pay

## 2021-08-11 NOTE — Telephone Encounter (Signed)
CALLED PATIENT NO ANSWER LEFT [employer] FOR A CALL BACK °Letter sent °

## 2021-09-01 ENCOUNTER — Other Ambulatory Visit: Payer: Self-pay | Admitting: Internal Medicine

## 2021-09-24 ENCOUNTER — Other Ambulatory Visit: Payer: Self-pay | Admitting: Internal Medicine

## 2021-09-26 ENCOUNTER — Encounter: Payer: Self-pay | Admitting: Dermatology

## 2021-09-26 ENCOUNTER — Ambulatory Visit: Payer: BC Managed Care – PPO | Admitting: Dermatology

## 2021-09-26 DIAGNOSIS — L2089 Other atopic dermatitis: Secondary | ICD-10-CM

## 2021-09-26 DIAGNOSIS — Z79899 Other long term (current) drug therapy: Secondary | ICD-10-CM | POA: Diagnosis not present

## 2021-09-26 DIAGNOSIS — L509 Urticaria, unspecified: Secondary | ICD-10-CM

## 2021-09-26 DIAGNOSIS — T07XXXA Unspecified multiple injuries, initial encounter: Secondary | ICD-10-CM

## 2021-09-26 DIAGNOSIS — L299 Pruritus, unspecified: Secondary | ICD-10-CM

## 2021-09-26 MED ORDER — DUPILUMAB 300 MG/2ML ~~LOC~~ SOSY
300.0000 mg | PREFILLED_SYRINGE | Freq: Once | SUBCUTANEOUS | Status: AC
  Administered 2021-09-26: 300 mg via SUBCUTANEOUS

## 2021-09-26 NOTE — Progress Notes (Unsigned)
Follow-Up Visit   Subjective  Jill Holmes is a 66 y.o. female who presents for the following: Urticaria and pruritus (3 month follow up. Improved at arms and neck. Flares at night sometimes on legs. Tries to remember to take Allegra at bedtime, sometimes takes a benadryl. Using Triamcinolone cream).  The following portions of the chart were reviewed this encounter and updated as appropriate:  Tobacco  Allergies  Meds  Problems  Med Hx  Surg Hx  Fam Hx     Review of Systems: No other skin or systemic complaints except as noted in HPI or Assessment and Plan.  Objective  Well appearing patient in no apparent distress; mood and affect are within normal limits.  A focused examination was performed including face, neck, arms, legs. Relevant physical exam findings are noted in the Assessment and Plan.  legs Scarring at arms. Legs with scattered crusted excoriations           legs BSA 18%  Scattered pink lesions, excoriations in various stages of healing   Assessment & Plan  Atopic dermatitis with pruritus and crusts and excoriations Whole body, but worse on legs Chronic and persistent condition with duration or expected duration over one year. Condition is symptomatic / bothersome to patient. Not to goal.  Discussed Dupixent with patient today. She prefers to start today. Plan to send in prescription if improving and tolerating.   Dupilumab (Dupixent) is a treatment given by injection for adults and children with moderate-to-severe atopic dermatitis. Goal is control of skin condition, not cure. It is given as 2 injections at the first dose followed by 1 injection ever 2 weeks thereafter.  Young children are dosed monthly.  Potential side effects include allergic reaction, herpes infections, injection site reactions and conjunctivitis (inflammation of the eyes).  The use of Dupixent requires long term medication management, including periodic office visits.  Loading  dose given today:  Dupixent 300 mg/65m injected into left upper arm Dupixent 300 mg/280minjected into right upper arm Lot: [id number]: 10/2023 NCD: [id number] Medications dupilumab (DUPIXENT) prefilled syringe 300 mg  Urticaria legs severe and generalized Continue Allegra 180 mg to 3 tablets daily, may need to increase to 4 if not controlled. Continue benadryl qhs as needed for itch. Continue TMC 0.1% cream qd - avoid face, groin, underarms Discussed Xolair.  We will start [place] noted above.  If Dupixent not helpful, reconsider Xolair.  Urticaria or hives is a pink to red patchy whelp- like rash of the skin that typically itches and it is the result of histamine release in the skin.   Hives may have multiple causes including stress, medications, infections, and systemic illness.  Sometimes there is a family history of chronic urticaria.   "Physical urticarias" may be caused by heat, sun, cold, vibration.   Insect bites can cause "papular urticaria". It is often difficult to find the cause of generalized hives.  Statistically, 70% of the time a cause of generalized hives is not found.  Sometimes hives can spontaneously resolve. Other times hives can persist and when it does, and no cause is found, and it has been at least 6 weeks since started, it is called "chronic idiopathic urticaria". Antihistamines are the mainstay for treatment.  In severe cases Xolair injections may be used.  Related Medications triamcinolone cream (KENALOG) 0.1 % APPLY TOPICALLY EVERY DAY. AVOID FACE, GROIN, UNDERARMS.  Return in about 2 weeks (around 10/10/2021) for Dupixent Follow Up/Dupixent Injection.  I,Caren Macadam  Parcell, CMA, am acting as scribe for Sarina Ser, MD. Documentation: I have reviewed the above documentation for accuracy and completeness, and I agree with the above.  Sarina Ser, MD

## 2021-09-26 NOTE — Patient Instructions (Addendum)
Continue Allegra 180 mg to 3 tablets daily, may need to increase to 4 if not controlled.   Continue benadryl qhs as needed for itch.   Continue TMC 0.1% cream qd - avoid face, groin, underarms   Dupilumab (Dupixent) is a treatment given by injection for adults and children with moderate-to-severe atopic dermatitis. Goal is control of skin condition, not cure. It is given as 2 injections at the first dose followed by 1 injection ever 2 weeks thereafter.  Young children are dosed monthly.  Potential side effects include allergic reaction, herpes infections, injection site reactions and conjunctivitis (inflammation of the eyes).  The use of Dupixent requires long term medication management, including periodic office visits.   Topical steroids (such as triamcinolone, fluocinolone, fluocinonide, mometasone, clobetasol, halobetasol, betamethasone, hydrocortisone) can cause thinning and lightening of the skin if they are used for too long in the same area. Your physician has selected the right strength medicine for your problem and area affected on the body. Please use your medication only as directed by your physician to prevent side effects.     Due to recent changes in healthcare laws, you may see results of your pathology and/or laboratory studies on MyChart before the doctors have had a chance to review them. We understand that in some cases there may be results that are confusing or concerning to you. Please understand that not all results are received at the same time and often the doctors may need to interpret multiple results in order to provide you with the best plan of care or course of treatment. Therefore, we ask that you please give [place] 2 business days to thoroughly review all your results before contacting the office for clarification. Should we see a critical lab result, you will be contacted sooner.   If You Need Anything After Your Visit  If you have any questions or concerns for your  doctor, please call our main line at [phone] and press option 4 to reach your doctor's medical assistant. If no one answers, please leave a voicemail as directed and we will return your call as soon as possible. Messages left after 4 pm will be answered the following business day.   You may also send [place] a message via [place]. We typically respond to MyChart messages within 1-2 business days.  For prescription refills, please ask your pharmacy to contact our office. Our fax number is [phone].  If you have an urgent issue when the clinic is closed that cannot wait until the next business day, you can page your doctor at the number below.    Please note that while we do our best to be available for urgent issues outside of office hours, we are not available 24/7.   If you have an urgent issue and are unable to reach [place], you may choose to seek medical care at your doctor's office, retail clinic, urgent care center, or emergency room.  If you have a medical emergency, please immediately call 911 or go to the emergency department.  Pager Numbers  - Dr. Nehemiah Massed: [phone]  - Dr. Laurence Ferrari: [phone]  - Dr. Nicole Kindred: [phone]  In the event of inclement weather, please call our main line at [phone] for an update on the status of any delays or closures.  Dermatology Medication Tips: Please keep the boxes that topical medications come in in order to help keep track of the instructions about where and how to use these. Pharmacies typically print the medication instructions only on the  boxes and not directly on the medication tubes.   If your medication is too expensive, please contact our office at [phone] option 4 or send [place] a message through [place].   We are unable to tell what your co-pay for medications will be in advance as this is different depending on your insurance coverage. However, we may be able to find a substitute medication at lower cost or fill out paperwork  to get insurance to cover a needed medication.   If a prior authorization is required to get your medication covered by your insurance company, please allow [place] 1-2 business days to complete this process.  Drug prices often vary depending on where the prescription is filled and some pharmacies may offer cheaper prices.  The website www.goodrx.com contains coupons for medications through different pharmacies. The prices here do not account for what the cost may be with help from insurance (it may be cheaper with your insurance), but the website can give you the price if you did not use any insurance.  - You can print the associated coupon and take it with your prescription to the pharmacy.  - You may also stop by our office during regular business hours and pick up a GoodRx coupon card.  - If you need your prescription sent electronically to a different pharmacy, notify our office through [facility] or by phone at [phone] option 4.     Si Usted Necesita Algo Despus de Su Visita  Tambin puede enviarnos un mensaje a travs de Pharmacist, community. Por lo general respondemos a los mensajes de MyChart en el transcurso de 1 a 2 das hbiles.  Para renovar recetas, por favor pida a su farmacia que se ponga en contacto con nuestra oficina. Harland Dingwall de fax es [place] [phone].  Si tiene un asunto urgente cuando la clnica est cerrada y que no puede esperar hasta el siguiente da hbil, puede llamar/localizar a su doctor(a) al nmero que aparece a continuacin.   Por favor, tenga en cuenta que aunque hacemos todo lo posible para estar disponibles para asuntos urgentes fuera del horario de [place], no estamos disponibles las 24 horas del da, los 7 das de la [place].   Si tiene un problema urgente y no puede comunicarse con nosotros, puede optar por buscar atencin mdica  en el consultorio de su doctor(a), en una clnica privada, en un centro de atencin urgente o en una sala de  emergencias.  Si tiene Engineering geologist, por favor llame inmediatamente al 911 o vaya a la sala de emergencias.  Nmeros de bper  - Dr. Nehemiah Massed: [phone]  - Dra. Moye: [phone]  - Dra. Nicole Kindred: [phone]  En caso de inclemencias del [place], por favor llame a Johnsie Kindred principal al [phone] para una actualizacin sobre el [place] de cualquier retraso o cierre.  Consejos para la medicacin en dermatologa: Por favor, guarde las cajas en las que vienen los medicamentos de uso tpico para ayudarle a seguir las instrucciones sobre dnde y cmo usarlos. Las farmacias generalmente imprimen las instrucciones del medicamento slo en las cajas y no directamente en los tubos del [place].   Si su medicamento es muy caro, por favor, pngase en contacto con Zigmund Daniel llamando al [phone] y presione la opcin 4 o envenos un mensaje a travs de Pharmacist, community.   No podemos decirle cul ser su copago por los medicamentos por adelantado ya que esto es diferente dependiendo de la cobertura de su seguro. Sin embargo, es posible que podamos  encontrar un medicamento sustituto a Electrical engineer un formulario para que el seguro cubra el medicamento que se considera necesario.   Si se requiere una autorizacin previa para que su compaa de seguros [place] su medicamento, por favor permtanos de 1 a 2 das hbiles para completar este proceso.  Los precios de los medicamentos varan con frecuencia dependiendo del Environmental consultant de dnde se surte la receta y alguna farmacias pueden ofrecer precios ms baratos.  El sitio web www.goodrx.com tiene cupones para medicamentos de Airline pilot. Los precios aqu no tienen en cuenta lo que podra costar con la ayuda del seguro (puede ser ms barato con su seguro), pero el sitio web puede darle el precio si no utiliz Research scientist (physical sciences).  - Puede imprimir el cupn correspondiente y llevarlo con su receta a la farmacia.  - Tambin puede pasar por  nuestra oficina durante el horario de atencin regular y Charity fundraiser una tarjeta de cupones de GoodRx.  - Si necesita que su receta se enve electrnicamente a una farmacia diferente, informe a nuestra oficina a travs de MyChart de [place] o por telfono llamando al [phone] y presione la opcin 4.

## 2021-09-27 ENCOUNTER — Encounter: Payer: Self-pay | Admitting: Dermatology

## 2021-10-10 ENCOUNTER — Ambulatory Visit: Payer: BC Managed Care – PPO | Admitting: Dermatology

## 2021-10-10 DIAGNOSIS — L509 Urticaria, unspecified: Secondary | ICD-10-CM

## 2021-10-10 DIAGNOSIS — L2081 Atopic neurodermatitis: Secondary | ICD-10-CM

## 2021-10-10 DIAGNOSIS — Z79899 Other long term (current) drug therapy: Secondary | ICD-10-CM

## 2021-10-10 DIAGNOSIS — L299 Pruritus, unspecified: Secondary | ICD-10-CM | POA: Diagnosis not present

## 2021-10-10 MED ORDER — DUPILUMAB 300 MG/2ML ~~LOC~~ SOSY
300.0000 mg | PREFILLED_SYRINGE | Freq: Once | SUBCUTANEOUS | Status: AC
  Administered 2021-10-10: 300 mg via SUBCUTANEOUS

## 2021-10-10 MED ORDER — TRIAMCINOLONE ACETONIDE 0.1 % EX CREA: 1.0000 | TOPICAL_CREAM | Freq: Two times a day (BID) | CUTANEOUS | 2 refills | Status: AC | PRN

## 2021-10-10 NOTE — Patient Instructions (Addendum)
Recommend N-acetylcysteine (NAC) 600 mg supplement three times per day to help with picking      Due to recent changes in healthcare laws, you may see results of your pathology and/or laboratory studies on MyChart before the doctors have had a chance to review them. We understand that in some cases there may be results that are confusing or concerning to you. Please understand that not all results are received at the same time and often the doctors may need to interpret multiple results in order to provide you with the best plan of care or course of treatment. Therefore, we ask that you please give us 2 business days to thoroughly review all your results before contacting the office for clarification. Should we see a critical lab result, you will be contacted sooner.   If You Need Anything After Your Visit  If you have any questions or concerns for your doctor, please call our main line at [phone] and press option 4 to reach your doctor's medical assistant. If no one answers, please leave a voicemail as directed and we will return your call as soon as possible. Messages left after 4 pm will be answered the following business day.   You may also send us a message via MyChart. We typically respond to MyChart messages within 1-2 business days.  For prescription refills, please ask your pharmacy to contact our office. Our fax number is [phone].  If you have an urgent issue when the clinic is closed that cannot wait until the next business day, you can page your doctor at the number below.    Please note that while we do our best to be available for urgent issues outside of office hours, we are not available 24/7.   If you have an urgent issue and are unable to reach us, you may choose to seek medical care at your doctor's office, retail clinic, urgent care center, or emergency room.  If you have a medical emergency, please immediately call 911 or go to the emergency department.  Pager  Numbers  - Dr. Kowalski: [phone]  - Dr. Moye: [phone]  - Dr. Stewart: [phone]  In the event of inclement weather, please call our main line at [phone] for an update on the status of any delays or closures.  Dermatology Medication Tips: Please keep the boxes that topical medications come in in order to help keep track of the instructions about where and how to use these. Pharmacies typically print the medication instructions only on the boxes and not directly on the medication tubes.   If your medication is too expensive, please contact our office at [phone] option 4 or send us a message through MyChart.   We are unable to tell what your co-pay for medications will be in advance as this is different depending on your insurance coverage. However, we may be able to find a substitute medication at lower cost or fill out paperwork to get insurance to cover a needed medication.   If a prior authorization is required to get your medication covered by your insurance company, please allow us 1-2 business days to complete this process.  Drug prices often vary depending on where the prescription is filled and some pharmacies may offer cheaper prices.  The website www.goodrx.com contains coupons for medications through different pharmacies. The prices here do not account for what the cost may be with help from insurance (it may be cheaper with your insurance), but the website can give you the price   if you did not use any insurance.  - You can print the associated coupon and take it with your prescription to the pharmacy.  - You may also stop by our office during regular business hours and pick up a GoodRx coupon card.  - If you need your prescription sent electronically to a different pharmacy, notify our office through [place] MyChart or by phone at [phone] option 4.     Si Usted Necesita Algo Despus de Su Visita  Tambin puede enviarnos un mensaje a travs de  MyChart. Por lo general respondemos a los mensajes de MyChart en el transcurso de 1 a 2 das hbiles.  Para renovar recetas, por favor pida a su farmacia que se ponga en contacto con nuestra oficina. Nuestro nmero de fax es el [phone].  Si tiene un asunto urgente cuando la clnica est cerrada y que no puede esperar hasta el siguiente da hbil, puede llamar/localizar a su doctor(a) al nmero que aparece a continuacin.   Por favor, tenga en cuenta que aunque hacemos todo lo posible para estar disponibles para asuntos urgentes fuera del horario de oficina, no estamos disponibles las 24 horas del da, los 7 das de la semana.   Si tiene un problema urgente y no puede comunicarse con nosotros, puede optar por buscar atencin mdica  en el consultorio de su doctor(a), en una clnica privada, en un centro de atencin urgente o en una sala de emergencias.  Si tiene una emergencia mdica, por favor llame inmediatamente al 911 o vaya a la sala de emergencias.  Nmeros de bper  - Dr. Kowalski: [phone]  - Dra. Moye: [phone]  - Dra. Stewart: [phone]  En caso de inclemencias del tiempo, por favor llame a nuestra lnea principal al [phone] para una actualizacin sobre el estado de cualquier retraso o cierre.  Consejos para la medicacin en dermatologa: Por favor, guarde las cajas en las que vienen los medicamentos de uso tpico para ayudarle a seguir las instrucciones sobre dnde y cmo usarlos. Las farmacias generalmente imprimen las instrucciones del medicamento slo en las cajas y no directamente en los tubos del medicamento.   Si su medicamento es muy caro, por favor, pngase en contacto con nuestra oficina llamando al [phone] y presione la opcin 4 o envenos un mensaje a travs de MyChart.   No podemos decirle cul ser su copago por los medicamentos por adelantado ya que esto es diferente dependiendo de la cobertura de su seguro. Sin embargo, es posible que  podamos encontrar un medicamento sustituto a menor costo o llenar un formulario para que el seguro cubra el medicamento que se considera necesario.   Si se requiere una autorizacin previa para que su compaa de seguros cubra su medicamento, por favor permtanos de 1 a 2 das hbiles para completar este proceso.  Los precios de los medicamentos varan con frecuencia dependiendo del lugar de dnde se surte la receta y alguna farmacias pueden ofrecer precios ms baratos.  El sitio web www.goodrx.com tiene cupones para medicamentos de diferentes farmacias. Los precios aqu no tienen en cuenta lo que podra costar con la ayuda del seguro (puede ser ms barato con su seguro), pero el sitio web puede darle el precio si no utiliz ningn seguro.  - Puede imprimir el cupn correspondiente y llevarlo con su receta a la farmacia.  - Tambin puede pasar por nuestra oficina durante el horario de atencin regular y recoger una tarjeta de cupones de GoodRx.  - Si necesita que su   receta se enve electrnicamente a una farmacia diferente, informe a nuestra oficina a travs de MyChart de [place] o por telfono llamando al [phone] y presione la opcin 4.  

## 2021-10-10 NOTE — Progress Notes (Signed)
Follow-Up Visit   Subjective  Jill Holmes is a 66 y.o. female who presents for the following: Dermatitis (2 weeks f/u atopic dermatitis -  loading does of Dupixent was 2 weeks ago, pt tolerated well, pt taking Allegra 180 mg 3 times a day and using Triamcinolone cream, pt would like to increase Allegra ).  The following portions of the chart were reviewed this encounter and updated as appropriate:   Tobacco  Allergies  Meds  Problems  Med Hx  Surg Hx  Fam Hx     Review of Systems:  No other skin or systemic complaints except as noted in HPI or Assessment and Plan.  Objective  Well appearing patient in no apparent distress; mood and affect are within normal limits.  A focused examination was performed including face,arms,legs. Relevant physical exam findings are noted in the Assessment and Plan.  face, neck, arms, legs Scarring at arms. 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 Condition is symptomatic / bothersome to patient. Not to goal but itching is improved  Atopic dermatitis - Severe, on Dupixent (biologic medication).  Atopic dermatitis (eczema) is a chronic, relapsing, pruritic condition that can significantly affect quality of life. It is often associated with allergic rhinitis and/or asthma and can require treatment with topical medications, phototherapy, or in severe cases a biologic medication called Dupixent, which requires long term medication management.   Continuen Dupixent 300 mg/38m injected into left upper arm Lot [id number] 10/2023 Continue Triamcinolone cream apply to affected skin qd-bid   May Allegra 180 mg to increase to 4 tablets  Continue benadryl qhs as needed for itch. Start  N-acetylcysteine (NAC) 600 mg supplement three times per day to help with picking   Dupilumab (Dupixent) is a treatment given by injection for adults and children with moderate-to-severe atopic dermatitis. Goal is control of skin condition, not cure. It is given as 2 injections at the first dose followed by 1 injection ever 2 weeks thereafter.  Young children are dosed monthly.  Potential side effects include allergic reaction, herpes infections, injection site reactions and conjunctivitis (inflammation of the eyes).  The use of Dupixent requires long term medication management, including periodic office visits.   Related Medications dupilumab (DUPIXENT) prefilled syringe 300 mg  Urticaria legs Urticaria or hives is a pink to red patchy whelp- like rash of the skin that typically itches and it is the result of histamine release in the skin.   Hives may have multiple causes including stress, medications, infections, and systemic illness.  Sometimes there is a family history of chronic urticaria.   "Physical urticarias" may be caused by heat, sun, cold, vibration.   Insect bites can cause "papular urticaria". It is often difficult to find the cause of generalized hives.  Statistically, 70% of the time a cause of generalized hives is not found.  Sometimes hives can spontaneously resolve. Other times hives can persist and when it does, and no cause is found, and it has been at least 6 weeks since started, it is called "chronic idiopathic urticaria". Antihistamines are the mainstay for treatment.  In severe cases Xolair injections may be used.   May  Allegra 180 mg to increase to 4 tablets  Continue benadryl qhs as needed for itch. Start  N-acetylcysteine (NAC) 600 mg supplement three times per day to help with picking  Continue Triamcinolone cream qd-bid prn   Related Medications triamcinolone cream  (KENALOG) 0.1 % APPLY TOPICALLY EVERY DAY. AVOID FACE, GROIN, UNDERARMS.  Return in about 2 weeks (around 10/24/2021) for Dupixent injection .  IMarye Round, CMA, am acting as scribe for Sarina Ser, MD .  Documentation: I have reviewed the above documentation for accuracy and completeness, and I agree with the above.  Sarina Ser, MD

## 2021-10-14 ENCOUNTER — Encounter: Payer: Self-pay | Admitting: Dermatology

## 2021-10-14 ENCOUNTER — Other Ambulatory Visit: Payer: Self-pay | Admitting: Internal Medicine

## 2021-10-21 ENCOUNTER — Other Ambulatory Visit: Payer: Self-pay | Admitting: Internal Medicine

## 2021-10-26 ENCOUNTER — Ambulatory Visit (INDEPENDENT_AMBULATORY_CARE_PROVIDER_SITE_OTHER): Payer: BC Managed Care – PPO | Admitting: Dermatology

## 2021-10-26 DIAGNOSIS — L2089 Other atopic dermatitis: Secondary | ICD-10-CM

## 2021-10-26 DIAGNOSIS — L981 Factitial dermatitis: Secondary | ICD-10-CM

## 2021-10-26 DIAGNOSIS — Z79899 Other long term (current) drug therapy: Secondary | ICD-10-CM | POA: Diagnosis not present

## 2021-10-26 DIAGNOSIS — L281 Prurigo nodularis: Secondary | ICD-10-CM | POA: Diagnosis not present

## 2021-10-26 DIAGNOSIS — L2081 Atopic neurodermatitis: Secondary | ICD-10-CM

## 2021-10-26 DIAGNOSIS — L299 Pruritus, unspecified: Secondary | ICD-10-CM

## 2021-10-26 MED ORDER — DUPILUMAB 300 MG/2ML ~~LOC~~ SOSY
300.0000 mg | PREFILLED_SYRINGE | Freq: Once | SUBCUTANEOUS | Status: AC
  Administered 2021-10-26: 300 mg via SUBCUTANEOUS

## 2021-10-26 NOTE — Progress Notes (Signed)
   Follow-Up Visit   Subjective  Jill Holmes is a 66 y.o. female who presents for the following: Eczema (Face, neck, arms, legs, 2 wk f/u, Dupixent '300mg'$ /66m sq injections q 2 wks, TMC 0.1% cr qd, Allegra qd, Benadryl qhs prn, pt did not get the NAC could not find the '600mg'$ , pt cant tell if doing any better).  The following portions of the chart were reviewed this encounter and updated as appropriate:   Tobacco  Allergies  Meds  Problems  Med Hx  Surg Hx  Fam Hx     Review of Systems:  No other skin or systemic complaints except as noted in HPI or Assessment and Plan.  Objective  Well appearing patient in no apparent distress; mood and affect are within normal limits.  A focused examination was performed including face, arms, legs. Relevant physical exam findings are noted in the Assessment and Plan.  face, neck, arms, legs Scarring at arms. 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  Atopic dermatitis (eczema) is a chronic, relapsing, pruritic condition that can significantly affect quality of life. It is often associated with allergic rhinitis and/or asthma and can require treatment with topical medications, phototherapy, or in severe cases a biologic medication called Dupixent, which requires long term medication management.    Chronic and persistent condition with duration or expected duration over one year. Condition is symptomatic / bothersome to patient. Not to goal.  Dupixent started 09/26/2021  Cont Dupixent '300mg'$ /275msq injections q 2 wks, If not seeing any improvement recommend pt continuing for at least a total of ten weeks on medication before stopping Cont TMC 0.1% cr qd up to 5d/wk aa body prn flares, avoid f/g/a Cont Allegra qam Cont Benadryl qhs prn  Recommend  N-acetylcysteine (NAC) 600 mg supplement three times per day to help with picking  Dupixent '300mg'$ /48m28mq injection today to R upper arm Lot [id number]  Dupilumab (Dupixent) is a treatment given by injection for adults and children with moderate-to-severe atopic dermatitis. Goal is control of skin condition, not cure. It is given as 2 injections at the first dose followed by 1 injection ever 2 weeks thereafter.  Young children are dosed monthly.  Potential side effects include allergic reaction, herpes infections, injection site reactions and conjunctivitis (inflammation of the eyes).  The use of Dupixent requires long term medication management, including periodic office visits.   Topical steroids (such as triamcinolone, fluocinolone, fluocinonide, mometasone, clobetasol, halobetasol, betamethasone, hydrocortisone) can cause thinning and lightening of the skin if they are used for too long in the same area. Your physician has selected the right strength medicine for your problem and area affected on the body. Please use your medication only as directed by your physician to prevent side effects.    dupilumab (DUPIXENT) prefilled syringe 300 mg - face, neck, arms, legs  Return in about 2 weeks (around 11/09/2021) for Atopic Derm.  I, SonOthelia PullingMA, am acting as scribe for DavSarina SerD . Documentation: I have reviewed the above documentation for accuracy and completeness, and I agree with the above.  DavSarina SerD

## 2021-10-26 NOTE — Patient Instructions (Signed)
Due to recent changes in healthcare laws, you may see results of your pathology and/or laboratory studies on MyChart before the doctors have had a chance to review them. We understand that in some cases there may be results that are confusing or concerning to you. Please understand that not all results are received at the same time and often the doctors may need to interpret multiple results in order to provide you with the best plan of care or course of treatment. Therefore, we ask that you please give us 2 business days to thoroughly review all your results before contacting the office for clarification. Should we see a critical lab result, you will be contacted sooner.   If You Need Anything After Your Visit  If you have any questions or concerns for your doctor, please call our main line at [phone] and press option 4 to reach your doctor's medical assistant. If no one answers, please leave a voicemail as directed and we will return your call as soon as possible. Messages left after 4 pm will be answered the following business day.   You may also send us a message via MyChart. We typically respond to MyChart messages within 1-2 business days.  For prescription refills, please ask your pharmacy to contact our office. Our fax number is [phone].  If you have an urgent issue when the clinic is closed that cannot wait until the next business day, you can page your doctor at the number below.    Please note that while we do our best to be available for urgent issues outside of office hours, we are not available 24/7.   If you have an urgent issue and are unable to reach us, you may choose to seek medical care at your doctor's office, retail clinic, urgent care center, or emergency room.  If you have a medical emergency, please immediately call 911 or go to the emergency department.  Pager Numbers  - Dr. Kowalski: [phone]  - Dr. Moye: [phone]  - Dr. Stewart:  [phone]  In the event of inclement weather, please call our main line at [phone] for an update on the status of any delays or closures.  Dermatology Medication Tips: Please keep the boxes that topical medications come in in order to help keep track of the instructions about where and how to use these. Pharmacies typically print the medication instructions only on the boxes and not directly on the medication tubes.   If your medication is too expensive, please contact our office at [phone] option 4 or send us a message through MyChart.   We are unable to tell what your co-pay for medications will be in advance as this is different depending on your insurance coverage. However, we may be able to find a substitute medication at lower cost or fill out paperwork to get insurance to cover a needed medication.   If a prior authorization is required to get your medication covered by your insurance company, please allow us 1-2 business days to complete this process.  Drug prices often vary depending on where the prescription is filled and some pharmacies may offer cheaper prices.  The website www.goodrx.com contains coupons for medications through different pharmacies. The prices here do not account for what the cost may be with help from insurance (it may be cheaper with your insurance), but the website can give you the price if you did not use any insurance.  - You can print the associated coupon and take it with   your prescription to the pharmacy.  - You may also stop by our office during regular business hours and pick up a GoodRx coupon card.  - If you need your prescription sent electronically to a different pharmacy, notify our office through [facility] MyChart or by phone at [phone] option 4.     Si Usted Necesita Algo Despus de Su Visita  Tambin puede enviarnos un mensaje a travs de MyChart. Por lo general respondemos a los mensajes de MyChart en el transcurso de 1 a 2  das hbiles.  Para renovar recetas, por favor pida a su farmacia que se ponga en contacto con nuestra oficina. Nuestro nmero de fax es el [phone].  Si tiene un asunto urgente cuando la clnica est cerrada y que no puede esperar hasta el siguiente da hbil, puede llamar/localizar a su doctor(a) al nmero que aparece a continuacin.   Por favor, tenga en cuenta que aunque hacemos todo lo posible para estar disponibles para asuntos urgentes fuera del horario de oficina, no estamos disponibles las 24 horas del da, los 7 das de la semana.   Si tiene un problema urgente y no puede comunicarse con nosotros, puede optar por buscar atencin mdica  en el consultorio de su doctor(a), en una clnica privada, en un centro de atencin urgente o en una sala de emergencias.  Si tiene una emergencia mdica, por favor llame inmediatamente al 911 o vaya a la sala de emergencias.  Nmeros de bper  - Dr. Kowalski: [phone]  - Dra. Moye: [phone]  - Dra. Stewart: [phone]  En caso de inclemencias del tiempo, por favor llame a nuestra lnea principal al [phone] para una actualizacin sobre el estado de cualquier retraso o cierre.  Consejos para la medicacin en dermatologa: Por favor, guarde las cajas en las que vienen los medicamentos de uso tpico para ayudarle a seguir las instrucciones sobre dnde y cmo usarlos. Las farmacias generalmente imprimen las instrucciones del medicamento slo en las cajas y no directamente en los tubos del medicamento.   Si su medicamento es muy caro, por favor, pngase en contacto con nuestra oficina llamando al [phone] y presione la opcin 4 o envenos un mensaje a travs de MyChart.   No podemos decirle cul ser su copago por los medicamentos por adelantado ya que esto es diferente dependiendo de la cobertura de su seguro. Sin embargo, es posible que podamos encontrar un medicamento sustituto a menor costo o llenar un formulario para que el  seguro cubra el medicamento que se considera necesario.   Si se requiere una autorizacin previa para que su compaa de seguros cubra su medicamento, por favor permtanos de 1 a 2 das hbiles para completar este proceso.  Los precios de los medicamentos varan con frecuencia dependiendo del lugar de dnde se surte la receta y alguna farmacias pueden ofrecer precios ms baratos.  El sitio web www.goodrx.com tiene cupones para medicamentos de diferentes farmacias. Los precios aqu no tienen en cuenta lo que podra costar con la ayuda del seguro (puede ser ms barato con su seguro), pero el sitio web puede darle el precio si no utiliz ningn seguro.  - Puede imprimir el cupn correspondiente y llevarlo con su receta a la farmacia.  - Tambin puede pasar por nuestra oficina durante el horario de atencin regular y recoger una tarjeta de cupones de GoodRx.  - Si necesita que su receta se enve electrnicamente a una farmacia diferente, informe a nuestra oficina a travs de MyChart de [facility]   o por telfono llamando al [phone] y presione la opcin 4.  

## 2021-10-30 ENCOUNTER — Encounter: Payer: Self-pay | Admitting: Dermatology

## 2021-11-08 ENCOUNTER — Ambulatory Visit: Payer: BC Managed Care – PPO | Admitting: Internal Medicine

## 2021-11-09 ENCOUNTER — Ambulatory Visit: Payer: BC Managed Care – PPO | Admitting: Dermatology

## 2021-11-09 DIAGNOSIS — Z79899 Other long term (current) drug therapy: Secondary | ICD-10-CM | POA: Diagnosis not present

## 2021-11-09 DIAGNOSIS — L2081 Atopic neurodermatitis: Secondary | ICD-10-CM

## 2021-11-09 DIAGNOSIS — L2089 Other atopic dermatitis: Secondary | ICD-10-CM | POA: Diagnosis not present

## 2021-11-09 DIAGNOSIS — L299 Pruritus, unspecified: Secondary | ICD-10-CM

## 2021-11-09 DIAGNOSIS — L981 Factitial dermatitis: Secondary | ICD-10-CM | POA: Diagnosis not present

## 2021-11-09 DIAGNOSIS — L281 Prurigo nodularis: Secondary | ICD-10-CM | POA: Diagnosis not present

## 2021-11-09 MED ORDER — MUPIROCIN 2 % EX OINT: TOPICAL_OINTMENT | CUTANEOUS | 0 refills | Status: DC

## 2021-11-09 MED ORDER — DUPILUMAB 300 MG/2ML ~~LOC~~ SOAJ
300.0000 mg | Freq: Once | SUBCUTANEOUS | Status: AC
  Administered 2021-11-09: 300 mg via SUBCUTANEOUS

## 2021-11-09 NOTE — Progress Notes (Unsigned)
   Follow-Up Visit   Subjective  Jill Holmes is a 66 y.o. female who presents for the following: Follow-up (2 wk f/u atopic dermatitis on the body, Dupixent '300mg'$ /56m sq injections q 2 wks, TMC 0.1% cr qd, Allegra qd, Benadryl qhs prn, pt taking NAC '600mg'$  tablets daily, pt cant tell if doing any better). Patient has had 4 Dupixent 300 mg injections,).  The following portions of the chart were reviewed this encounter and updated as appropriate:   Tobacco  Allergies  Meds  Problems  Med Hx  Surg Hx  Fam Hx     Review of Systems:  No other skin or systemic complaints except as noted in HPI or Assessment and Plan.  Objective  Well appearing patient in no apparent distress; mood and affect are within normal limits.  A focused examination was performed including arms,legs. Relevant physical exam findings are noted in the Assessment and Plan.  trunk, exts Scarring at arms. 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  Atopic dermatitis (eczema) is a chronic, relapsing, pruritic condition that can significantly affect quality of life. It is often associated with allergic rhinitis and/or asthma and can require treatment with topical medications, phototherapy, or in severe cases a biologic medication called Dupixent, which requires long term medication management.     Chronic and persistent condition with duration or expected duration over one year. Condition is symptomatic / bothersome to patient. Not to goal.  Dupixent started 09/26/2021   Cont Dupixent '300mg'$ /245msq injections q 2 wks, If not seeing any improvement recommend pt continuing for at least a total of ten weeks on medication before stopping Cont TMC 0.1% cr qd up to 5d/wk aa body prn flares, avoid f/g/a Cont Allegra qam Cont  Benadryl qhs prn  Continue N-acetylcysteine (NAC) 600 mg supplement three times per day to help with picking Start Mupirocin ointment apply to affected scabs or sores on body qd-bid   Dupixent '300mg'$ /64m18mq injection today to L upper arm Lot [id number]   Dupilumab (Dupixent) is a treatment given by injection for adults and children with moderate-to-severe atopic dermatitis. Goal is control of skin condition, not cure. It is given as 2 injections at the first dose followed by 1 injection ever 2 weeks thereafter.  Young children are dosed monthly.   Potential side effects include allergic reaction, herpes infections, injection site reactions and conjunctivitis (inflammation of the eyes).  The use of Dupixent requires long term medication management, including periodic office visits.    Topical steroids (such as triamcinolone, fluocinolone, fluocinonide, mometasone, clobetasol, halobetasol, betamethasone, hydrocortisone) can cause thinning and lightening of the skin if they are used for too long in the same area. Your physician has selected the right strength medicine for your problem and area affected on the body. Please use your medication only as directed by your physician to prevent side effects.      Related Medications mupirocin ointment (BACTROBAN) 2 % Apply to skin qd-bid to any scabs or sores  Dupilumab SOPN 300 mg   Return in about 2 weeks (around 11/23/2021) for Dupixent .  I, MMarye RoundMA, am acting as scribe for DavSarina SerD . Documentation: I have reviewed the above documentation for accuracy and completeness, and I agree with the above.  DavSarina SerD

## 2021-11-09 NOTE — Patient Instructions (Signed)
Due to recent changes in healthcare laws, you may see results of your pathology and/or laboratory studies on MyChart before the doctors have had a chance to review them. We understand that in some cases there may be results that are confusing or concerning to you. Please understand that not all results are received at the same time and often the doctors may need to interpret multiple results in order to provide you with the best plan of care or course of treatment. Therefore, we ask that you please give us 2 business days to thoroughly review all your results before contacting the office for clarification. Should we see a critical lab result, you will be contacted sooner.   If You Need Anything After Your Visit  If you have any questions or concerns for your doctor, please call our main line at [phone] and press option 4 to reach your doctor's medical assistant. If no one answers, please leave a voicemail as directed and we will return your call as soon as possible. Messages left after 4 pm will be answered the following business day.   You may also send us a message via MyChart. We typically respond to MyChart messages within 1-2 business days.  For prescription refills, please ask your pharmacy to contact our office. Our fax number is [phone].  If you have an urgent issue when the clinic is closed that cannot wait until the next business day, you can page your doctor at the number below.    Please note that while we do our best to be available for urgent issues outside of office hours, we are not available 24/7.   If you have an urgent issue and are unable to reach us, you may choose to seek medical care at your doctor's office, retail clinic, urgent care center, or emergency room.  If you have a medical emergency, please immediately call 911 or go to the emergency department.  Pager Numbers  - Dr. Kowalski: [phone]  - Dr. Moye: [phone]  - Dr. Stewart:  [phone]  In the event of inclement weather, please call our main line at [phone] for an update on the status of any delays or closures.  Dermatology Medication Tips: Please keep the boxes that topical medications come in in order to help keep track of the instructions about where and how to use these. Pharmacies typically print the medication instructions only on the boxes and not directly on the medication tubes.   If your medication is too expensive, please contact our office at [phone] option 4 or send us a message through MyChart.   We are unable to tell what your co-pay for medications will be in advance as this is different depending on your insurance coverage. However, we may be able to find a substitute medication at lower cost or fill out paperwork to get insurance to cover a needed medication.   If a prior authorization is required to get your medication covered by your insurance company, please allow us 1-2 business days to complete this process.  Drug prices often vary depending on where the prescription is filled and some pharmacies may offer cheaper prices.  The website www.goodrx.com contains coupons for medications through different pharmacies. The prices here do not account for what the cost may be with help from insurance (it may be cheaper with your insurance), but the website can give you the price if you did not use any insurance.  - You can print the associated coupon and take it with   your prescription to the pharmacy.  - You may also stop by our office during regular business hours and pick up a GoodRx coupon card.  - If you need your prescription sent electronically to a different pharmacy, notify our office through [facility] MyChart or by phone at [phone] option 4.     Si Usted Necesita Algo Despus de Su Visita  Tambin puede enviarnos un mensaje a travs de MyChart. Por lo general respondemos a los mensajes de MyChart en el transcurso de 1 a 2  das hbiles.  Para renovar recetas, por favor pida a su farmacia que se ponga en contacto con nuestra oficina. Nuestro nmero de fax es el [phone].  Si tiene un asunto urgente cuando la clnica est cerrada y que no puede esperar hasta el siguiente da hbil, puede llamar/localizar a su doctor(a) al nmero que aparece a continuacin.   Por favor, tenga en cuenta que aunque hacemos todo lo posible para estar disponibles para asuntos urgentes fuera del horario de oficina, no estamos disponibles las 24 horas del da, los 7 das de la semana.   Si tiene un problema urgente y no puede comunicarse con nosotros, puede optar por buscar atencin mdica  en el consultorio de su doctor(a), en una clnica privada, en un centro de atencin urgente o en una sala de emergencias.  Si tiene una emergencia mdica, por favor llame inmediatamente al 911 o vaya a la sala de emergencias.  Nmeros de bper  - Dr. Kowalski: [phone]  - Dra. Moye: [phone]  - Dra. Stewart: [phone]  En caso de inclemencias del tiempo, por favor llame a nuestra lnea principal al [phone] para una actualizacin sobre el estado de cualquier retraso o cierre.  Consejos para la medicacin en dermatologa: Por favor, guarde las cajas en las que vienen los medicamentos de uso tpico para ayudarle a seguir las instrucciones sobre dnde y cmo usarlos. Las farmacias generalmente imprimen las instrucciones del medicamento slo en las cajas y no directamente en los tubos del medicamento.   Si su medicamento es muy caro, por favor, pngase en contacto con nuestra oficina llamando al [phone] y presione la opcin 4 o envenos un mensaje a travs de MyChart.   No podemos decirle cul ser su copago por los medicamentos por adelantado ya que esto es diferente dependiendo de la cobertura de su seguro. Sin embargo, es posible que podamos encontrar un medicamento sustituto a menor costo o llenar un formulario para que el  seguro cubra el medicamento que se considera necesario.   Si se requiere una autorizacin previa para que su compaa de seguros cubra su medicamento, por favor permtanos de 1 a 2 das hbiles para completar este proceso.  Los precios de los medicamentos varan con frecuencia dependiendo del lugar de dnde se surte la receta y alguna farmacias pueden ofrecer precios ms baratos.  El sitio web www.goodrx.com tiene cupones para medicamentos de diferentes farmacias. Los precios aqu no tienen en cuenta lo que podra costar con la ayuda del seguro (puede ser ms barato con su seguro), pero el sitio web puede darle el precio si no utiliz ningn seguro.  - Puede imprimir el cupn correspondiente y llevarlo con su receta a la farmacia.  - Tambin puede pasar por nuestra oficina durante el horario de atencin regular y recoger una tarjeta de cupones de GoodRx.  - Si necesita que su receta se enve electrnicamente a una farmacia diferente, informe a nuestra oficina a travs de MyChart de [facility]   o por telfono llamando al [phone] y presione la opcin 4.  

## 2021-11-10 ENCOUNTER — Encounter: Payer: Self-pay | Admitting: Dermatology

## 2021-11-23 ENCOUNTER — Other Ambulatory Visit: Payer: Self-pay | Admitting: Dermatology

## 2021-11-23 ENCOUNTER — Ambulatory Visit: Payer: BC Managed Care – PPO | Admitting: Dermatology

## 2021-11-23 DIAGNOSIS — L281 Prurigo nodularis: Secondary | ICD-10-CM

## 2021-11-23 DIAGNOSIS — L2081 Atopic neurodermatitis: Secondary | ICD-10-CM

## 2021-11-23 DIAGNOSIS — Z79899 Other long term (current) drug therapy: Secondary | ICD-10-CM

## 2021-11-23 MED ORDER — DUPILUMAB 300 MG/2ML ~~LOC~~ SOSY
300.0000 mg | PREFILLED_SYRINGE | Freq: Once | SUBCUTANEOUS | Status: AC
  Administered 2021-11-23: 300 mg via SUBCUTANEOUS

## 2021-11-23 NOTE — Progress Notes (Signed)
   Follow-Up Visit   Subjective  Jill Holmes is a 66 y.o. female who presents for the following: Follow-up (2 week follow up - Atopic dermatitis - Dupixent injections).  The following portions of the chart were reviewed this encounter and updated as appropriate:   Tobacco  Allergies  Meds  Problems  Med Hx  Surg Hx  Fam Hx     Review of Systems:  No other skin or systemic complaints except as noted in HPI or Assessment and Plan.  Objective  Well appearing patient in no apparent distress; mood and affect are within normal limits.  A focused examination was performed including arms, legs. Relevant physical exam findings are noted in the Assessment and Plan.  Excoriations.   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  Atopic dermatitis (eczema) is a chronic, relapsing, pruritic condition that can significantly affect quality of life. It is often associated with allergic rhinitis and/or asthma and can require treatment with topical medications, phototherapy, or in severe cases a biologic medication called Dupixent, which requires long term medication management.     Chronic and persistent condition with duration or expected duration over one year. Condition is symptomatic / bothersome to patient. Not to goal.  Dupixent started 09/26/2021. We will plan at least 3 more visits on Dupixent before changing treatment.   Cont Dupixent '300mg'$ /44m sq injections q 2 wks, If not seeing any improvement recommend pt continuing for at least a total of ten weeks on medication before stopping Cont TMC 0.1% cr qd up to 5d/wk aa body prn flares, avoid f/g/a Cont Allegra qam Cont Benadryl qhs prn  Continue N-acetylcysteine (NAC) 600 mg supplement three times per day to help with picking Start Mupirocin ointment apply to affected scabs or sores on body qd-bid    Dupixent  '300mg'$ /266msq injection today to right upper arm Lot #[id number] 11/2023   Dupilumab (Dupixent) is a treatment given by injection for adults and children with moderate-to-severe atopic dermatitis. Goal is control of skin condition, not cure. It is given as 2 injections at the first dose followed by 1 injection ever 2 weeks thereafter.  Young children are dosed monthly.   Potential side effects include allergic reaction, herpes infections, injection site reactions and conjunctivitis (inflammation of the eyes).  The use of Dupixent requires long term medication management, including periodic office visits.    Topical steroids (such as triamcinolone, fluocinolone, fluocinonide, mometasone, clobetasol, halobetasol, betamethasone, hydrocortisone) can cause thinning and lightening of the skin if they are used for too long in the same area. Your physician has selected the right strength medicine for your problem and area affected on the body. Please use your medication only as directed by your physician to prevent side effects.     dupilumab (DUPIXENT) prefilled syringe 300 mg  Related Medications mupirocin ointment (BACTROBAN) 2 % Apply to skin qd-bid to any scabs or sores  Return in about 2 weeks (around 12/07/2021) for Dupixent.  I, HoAshok CordiaCMA, am acting as scribe for DaSarina SerMD . Documentation: I have reviewed the above documentation for accuracy and completeness, and I agree with the above.  DaSarina SerMD

## 2021-11-23 NOTE — Patient Instructions (Signed)
Due to recent changes in healthcare laws, you may see results of your pathology and/or laboratory studies on MyChart before the doctors have had a chance to review them. We understand that in some cases there may be results that are confusing or concerning to you. Please understand that not all results are received at the same time and often the doctors may need to interpret multiple results in order to provide you with the best plan of care or course of treatment. Therefore, we ask that you please give us 2 business days to thoroughly review all your results before contacting the office for clarification. Should we see a critical lab result, you will be contacted sooner.   If You Need Anything After Your Visit  If you have any questions or concerns for your doctor, please call our main line at [phone] and press option 4 to reach your doctor's medical assistant. If no one answers, please leave a voicemail as directed and we will return your call as soon as possible. Messages left after 4 pm will be answered the following business day.   You may also send us a message via MyChart. We typically respond to MyChart messages within 1-2 business days.  For prescription refills, please ask your pharmacy to contact our office. Our fax number is [phone].  If you have an urgent issue when the clinic is closed that cannot wait until the next business day, you can page your doctor at the number below.    Please note that while we do our best to be available for urgent issues outside of office hours, we are not available 24/7.   If you have an urgent issue and are unable to reach us, you may choose to seek medical care at your doctor's office, retail clinic, urgent care center, or emergency room.  If you have a medical emergency, please immediately call 911 or go to the emergency department.  Pager Numbers  - Dr. Kowalski: [phone]  - Dr. Moye: [phone]  - Dr. Stewart:  [phone]  In the event of inclement weather, please call our main line at [phone] for an update on the status of any delays or closures.  Dermatology Medication Tips: Please keep the boxes that topical medications come in in order to help keep track of the instructions about where and how to use these. Pharmacies typically print the medication instructions only on the boxes and not directly on the medication tubes.   If your medication is too expensive, please contact our office at [phone] option 4 or send us a message through MyChart.   We are unable to tell what your co-pay for medications will be in advance as this is different depending on your insurance coverage. However, we may be able to find a substitute medication at lower cost or fill out paperwork to get insurance to cover a needed medication.   If a prior authorization is required to get your medication covered by your insurance company, please allow us 1-2 business days to complete this process.  Drug prices often vary depending on where the prescription is filled and some pharmacies may offer cheaper prices.  The website www.goodrx.com contains coupons for medications through different pharmacies. The prices here do not account for what the cost may be with help from insurance (it may be cheaper with your insurance), but the website can give you the price if you did not use any insurance.  - You can print the associated coupon and take it with   your prescription to the pharmacy.  - You may also stop by our office during regular business hours and pick up a GoodRx coupon card.  - If you need your prescription sent electronically to a different pharmacy, notify our office through [facility] MyChart or by phone at [phone] option 4.     Si Usted Necesita Algo Despus de Su Visita  Tambin puede enviarnos un mensaje a travs de MyChart. Por lo general respondemos a los mensajes de MyChart en el transcurso de 1 a 2  das hbiles.  Para renovar recetas, por favor pida a su farmacia que se ponga en contacto con nuestra oficina. Nuestro nmero de fax es el [phone].  Si tiene un asunto urgente cuando la clnica est cerrada y que no puede esperar hasta el siguiente da hbil, puede llamar/localizar a su doctor(a) al nmero que aparece a continuacin.   Por favor, tenga en cuenta que aunque hacemos todo lo posible para estar disponibles para asuntos urgentes fuera del horario de oficina, no estamos disponibles las 24 horas del da, los 7 das de la semana.   Si tiene un problema urgente y no puede comunicarse con nosotros, puede optar por buscar atencin mdica  en el consultorio de su doctor(a), en una clnica privada, en un centro de atencin urgente o en una sala de emergencias.  Si tiene una emergencia mdica, por favor llame inmediatamente al 911 o vaya a la sala de emergencias.  Nmeros de bper  - Dr. Kowalski: [phone]  - Dra. Moye: [phone]  - Dra. Stewart: [phone]  En caso de inclemencias del tiempo, por favor llame a nuestra lnea principal al [phone] para una actualizacin sobre el estado de cualquier retraso o cierre.  Consejos para la medicacin en dermatologa: Por favor, guarde las cajas en las que vienen los medicamentos de uso tpico para ayudarle a seguir las instrucciones sobre dnde y cmo usarlos. Las farmacias generalmente imprimen las instrucciones del medicamento slo en las cajas y no directamente en los tubos del medicamento.   Si su medicamento es muy caro, por favor, pngase en contacto con nuestra oficina llamando al [phone] y presione la opcin 4 o envenos un mensaje a travs de MyChart.   No podemos decirle cul ser su copago por los medicamentos por adelantado ya que esto es diferente dependiendo de la cobertura de su seguro. Sin embargo, es posible que podamos encontrar un medicamento sustituto a menor costo o llenar un formulario para que el  seguro cubra el medicamento que se considera necesario.   Si se requiere una autorizacin previa para que su compaa de seguros cubra su medicamento, por favor permtanos de 1 a 2 das hbiles para completar este proceso.  Los precios de los medicamentos varan con frecuencia dependiendo del lugar de dnde se surte la receta y alguna farmacias pueden ofrecer precios ms baratos.  El sitio web www.goodrx.com tiene cupones para medicamentos de diferentes farmacias. Los precios aqu no tienen en cuenta lo que podra costar con la ayuda del seguro (puede ser ms barato con su seguro), pero el sitio web puede darle el precio si no utiliz ningn seguro.  - Puede imprimir el cupn correspondiente y llevarlo con su receta a la farmacia.  - Tambin puede pasar por nuestra oficina durante el horario de atencin regular y recoger una tarjeta de cupones de GoodRx.  - Si necesita que su receta se enve electrnicamente a una farmacia diferente, informe a nuestra oficina a travs de MyChart de    o por telfono llamando al [phone] y presione la opcin 4.  

## 2021-11-28 ENCOUNTER — Encounter: Payer: Self-pay | Admitting: Dermatology

## 2021-11-29 ENCOUNTER — Other Ambulatory Visit: Payer: Self-pay | Admitting: Internal Medicine

## 2021-11-29 DIAGNOSIS — E119 Type 2 diabetes mellitus without complications: Secondary | ICD-10-CM

## 2021-12-07 ENCOUNTER — Ambulatory Visit: Payer: BC Managed Care – PPO | Admitting: Dermatology

## 2021-12-07 DIAGNOSIS — Z79899 Other long term (current) drug therapy: Secondary | ICD-10-CM

## 2021-12-07 DIAGNOSIS — L2081 Atopic neurodermatitis: Secondary | ICD-10-CM | POA: Diagnosis not present

## 2021-12-07 MED ORDER — DUPILUMAB 300 MG/2ML ~~LOC~~ SOSY
300.0000 mg | PREFILLED_SYRINGE | Freq: Once | SUBCUTANEOUS | Status: AC
  Administered 2021-12-07: 300 mg via SUBCUTANEOUS

## 2021-12-07 NOTE — Progress Notes (Signed)
   Follow-Up Visit   Subjective  Jill Holmes is a 66 y.o. female who presents for the following: Follow-up (2 week follow up -  Atopic Neurodermatitis - still having some itching of her legs).  The following portions of the chart were reviewed this encounter and updated as appropriate:   Tobacco  Allergies  Meds  Problems  Med Hx  Surg Hx  Fam Hx     Review of Systems:  No other skin or systemic complaints except as noted in HPI or Assessment and Plan.  Objective  Well appearing patient in no apparent distress; mood and affect are within normal limits.  A focused examination was performed including arms, legs. Relevant physical exam findings are noted in the Assessment and Plan.       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  Atopic dermatitis (eczema) is a chronic, relapsing, pruritic condition that can significantly affect quality of life. It is often associated with allergic rhinitis and/or asthma and can require treatment with topical medications, phototherapy, or in severe cases a biologic medication called Dupixent, which requires long term medication management.     Chronic and persistent condition with duration or expected duration over one year. Condition is symptomatic / bothersome to patient. Not to goal.  Dupixent started 09/26/2021. May consider starting Rinvoq on follow up.    Cont Dupixent '300mg'$ /27m sq injections q 2 wks, If not seeing any improvement recommend pt continuing for at least a total of ten weeks on medication before stopping Cont TMC 0.1% cr qd up to 5d/wk aa body prn flares, avoid f/g/a Cont Allegra qam Cont Benadryl qhs prn  Continue N-acetylcysteine (NAC) 600 mg supplement three times per day to help with picking Start Mupirocin ointment apply to affected scabs or sores on body  qd-bid    Dupixent '300mg'$ /264msq injection today to left upper arm Lot #[id number] 11/2023   Dupilumab (Dupixent) is a treatment given by injection for adults and children with moderate-to-severe atopic dermatitis. Goal is control of skin condition, not cure. It is given as 2 injections at the first dose followed by 1 injection ever 2 weeks thereafter.  Young children are dosed monthly.   Potential side effects include allergic reaction, herpes infections, injection site reactions and conjunctivitis (inflammation of the eyes).  The use of Dupixent requires long term medication management, including periodic office visits.    Topical steroids (such as triamcinolone, fluocinolone, fluocinonide, mometasone, clobetasol, halobetasol, betamethasone, hydrocortisone) can cause thinning and lightening of the skin if they are used for too long in the same area. Your physician has selected the right strength medicine for your problem and area affected on the body. Please use your medication only as directed by your physician to prevent side effects.    dupilumab (DUPIXENT) prefilled syringe 300 mg  Related Medications mupirocin ointment (BACTROBAN) 2 % APPLY TO ANY SCABS OR SORES 1-2 TIMES DAILY AS DIRECTED.  Return in about 2 weeks (around 12/21/2021) for [place] I, HoAshok CordiaCMA, am acting as scribe for DaSarina SerMD . Documentation: I have reviewed the above documentation for accuracy and completeness, and I agree with the above.  DaSarina SerMD

## 2021-12-08 ENCOUNTER — Other Ambulatory Visit: Payer: Self-pay | Admitting: Internal Medicine

## 2021-12-15 ENCOUNTER — Other Ambulatory Visit: Payer: Self-pay | Admitting: Internal Medicine

## 2021-12-17 ENCOUNTER — Encounter: Payer: Self-pay | Admitting: Dermatology

## 2021-12-21 ENCOUNTER — Ambulatory Visit: Payer: BC Managed Care – PPO | Admitting: Dermatology

## 2021-12-21 ENCOUNTER — Encounter: Payer: Self-pay | Admitting: Dermatology

## 2021-12-21 DIAGNOSIS — Z79899 Other long term (current) drug therapy: Secondary | ICD-10-CM

## 2021-12-21 DIAGNOSIS — L281 Prurigo nodularis: Secondary | ICD-10-CM

## 2021-12-21 DIAGNOSIS — L2081 Atopic neurodermatitis: Secondary | ICD-10-CM | POA: Diagnosis not present

## 2021-12-21 DIAGNOSIS — L299 Pruritus, unspecified: Secondary | ICD-10-CM

## 2021-12-21 DIAGNOSIS — T07XXXA Unspecified multiple injuries, initial encounter: Secondary | ICD-10-CM

## 2021-12-21 MED ORDER — DUPILUMAB 300 MG/2ML ~~LOC~~ SOSY
300.0000 mg | PREFILLED_SYRINGE | Freq: Once | SUBCUTANEOUS | Status: AC
  Administered 2021-12-21: 300 mg via SUBCUTANEOUS

## 2021-12-21 NOTE — Patient Instructions (Signed)
Due to recent changes in healthcare laws, you may see results of your pathology and/or laboratory studies on MyChart before the doctors have had a chance to review them. We understand that in some cases there may be results that are confusing or concerning to you. Please understand that not all results are received at the same time and often the doctors may need to interpret multiple results in order to provide you with the best plan of care or course of treatment. Therefore, we ask that you please give us 2 business days to thoroughly review all your results before contacting the office for clarification. Should we see a critical lab result, you will be contacted sooner.   If You Need Anything After Your Visit  If you have any questions or concerns for your doctor, please call our main line at [phone] and press option 4 to reach your doctor's medical assistant. If no one answers, please leave a voicemail as directed and we will return your call as soon as possible. Messages left after 4 pm will be answered the following business day.   You may also send us a message via MyChart. We typically respond to MyChart messages within 1-2 business days.  For prescription refills, please ask your pharmacy to contact our office. Our fax number is [phone].  If you have an urgent issue when the clinic is closed that cannot wait until the next business day, you can page your doctor at the number below.    Please note that while we do our best to be available for urgent issues outside of office hours, we are not available 24/7.   If you have an urgent issue and are unable to reach us, you may choose to seek medical care at your doctor's office, retail clinic, urgent care center, or emergency room.  If you have a medical emergency, please immediately call 911 or go to the emergency department.  Pager Numbers  - Dr. Kowalski: [phone]  - Dr. Moye: [phone]  - Dr. Stewart:  [phone]  In the event of inclement weather, please call our main line at [phone] for an update on the status of any delays or closures.  Dermatology Medication Tips: Please keep the boxes that topical medications come in in order to help keep track of the instructions about where and how to use these. Pharmacies typically print the medication instructions only on the boxes and not directly on the medication tubes.   If your medication is too expensive, please contact our office at [phone] option 4 or send us a message through MyChart.   We are unable to tell what your co-pay for medications will be in advance as this is different depending on your insurance coverage. However, we may be able to find a substitute medication at lower cost or fill out paperwork to get insurance to cover a needed medication.   If a prior authorization is required to get your medication covered by your insurance company, please allow us 1-2 business days to complete this process.  Drug prices often vary depending on where the prescription is filled and some pharmacies may offer cheaper prices.  The website www.goodrx.com contains coupons for medications through different pharmacies. The prices here do not account for what the cost may be with help from insurance (it may be cheaper with your insurance), but the website can give you the price if you did not use any insurance.  - You can print the associated coupon and take it with   your prescription to the pharmacy.  - You may also stop by our office during regular business hours and pick up a GoodRx coupon card.  - If you need your prescription sent electronically to a different pharmacy, notify our office through [facility] MyChart or by phone at [phone] option 4.     Si Usted Necesita Algo Despus de Su Visita  Tambin puede enviarnos un mensaje a travs de MyChart. Por lo general respondemos a los mensajes de MyChart en el transcurso de 1 a 2  das hbiles.  Para renovar recetas, por favor pida a su farmacia que se ponga en contacto con nuestra oficina. Nuestro nmero de fax es el [phone].  Si tiene un asunto urgente cuando la clnica est cerrada y que no puede esperar hasta el siguiente da hbil, puede llamar/localizar a su doctor(a) al nmero que aparece a continuacin.   Por favor, tenga en cuenta que aunque hacemos todo lo posible para estar disponibles para asuntos urgentes fuera del horario de oficina, no estamos disponibles las 24 horas del da, los 7 das de la semana.   Si tiene un problema urgente y no puede comunicarse con nosotros, puede optar por buscar atencin mdica  en el consultorio de su doctor(a), en una clnica privada, en un centro de atencin urgente o en una sala de emergencias.  Si tiene una emergencia mdica, por favor llame inmediatamente al 911 o vaya a la sala de emergencias.  Nmeros de bper  - Dr. Kowalski: [phone]  - Dra. Moye: [phone]  - Dra. Stewart: [phone]  En caso de inclemencias del tiempo, por favor llame a nuestra lnea principal al [phone] para una actualizacin sobre el estado de cualquier retraso o cierre.  Consejos para la medicacin en dermatologa: Por favor, guarde las cajas en las que vienen los medicamentos de uso tpico para ayudarle a seguir las instrucciones sobre dnde y cmo usarlos. Las farmacias generalmente imprimen las instrucciones del medicamento slo en las cajas y no directamente en los tubos del medicamento.   Si su medicamento es muy caro, por favor, pngase en contacto con nuestra oficina llamando al [phone] y presione la opcin 4 o envenos un mensaje a travs de MyChart.   No podemos decirle cul ser su copago por los medicamentos por adelantado ya que esto es diferente dependiendo de la cobertura de su seguro. Sin embargo, es posible que podamos encontrar un medicamento sustituto a menor costo o llenar un formulario para que el  seguro cubra el medicamento que se considera necesario.   Si se requiere una autorizacin previa para que su compaa de seguros cubra su medicamento, por favor permtanos de 1 a 2 das hbiles para completar este proceso.  Los precios de los medicamentos varan con frecuencia dependiendo del lugar de dnde se surte la receta y alguna farmacias pueden ofrecer precios ms baratos.  El sitio web www.goodrx.com tiene cupones para medicamentos de diferentes farmacias. Los precios aqu no tienen en cuenta lo que podra costar con la ayuda del seguro (puede ser ms barato con su seguro), pero el sitio web puede darle el precio si no utiliz ningn seguro.  - Puede imprimir el cupn correspondiente y llevarlo con su receta a la farmacia.  - Tambin puede pasar por nuestra oficina durante el horario de atencin regular y recoger una tarjeta de cupones de GoodRx.  - Si necesita que su receta se enve electrnicamente a una farmacia diferente, informe a nuestra oficina a travs de MyChart de [facility]   o por telfono llamando al [phone] y presione la opcin 4.  

## 2021-12-21 NOTE — Progress Notes (Signed)
pi  Follow-Up Visit   Subjective  Jill Holmes is a 66 y.o. female who presents for the following: Rash (Atopic dermatitis. Taking NAC twice daily. Using Triamcinolone 0.1% cream, taking Allegra qam and Benadry qhs. Here for Dupixent injection. On Dupixent since 09/26/2021). She has some improvement but still itching occasionally especially when she shaves her legs she will get scabs and scratches.  This recently flared.  She wants to continue Dupixent at this time.  The following portions of the chart were reviewed this encounter and updated as appropriate:  Tobacco  Allergies  Meds  Problems  Med Hx  Surg Hx  Fam Hx     Review of Systems: No other skin or systemic complaints except as noted in HPI or Assessment and Plan.  Objective  Well appearing patient in no apparent distress; mood and affect are within normal limits.  A focused examination was performed including upper extremities, including the arms, hands, fingers, and fingernails and lower extremities, including the legs, feet, toes, and toenails. Relevant physical exam findings are noted in the Assessment and Plan.  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  Atopic dermatitis (eczema) is a chronic, relapsing, pruritic condition that can significantly affect quality of life. It is often associated with allergic rhinitis and/or asthma and can require treatment with topical medications, phototherapy, or in severe cases a biologic medication called Dupixent, which requires long term medication management.     Chronic and persistent condition with duration or expected duration over one year. Condition is symptomatic / bothersome to patient. Not to goal.  Flared. Dupixent started 09/26/2021. May consider starting Rinvoq on follow up.    Cont  Dupixent '300mg'$ /28m sq injections q 2 wks, If not seeing any improvement recommend pt continuing for at least a total of ten weeks on medication before stopping Cont TMC 0.1% cr qd up to 5d/wk aa body prn flares, avoid f/g/a Cont Allegra qam Cont Benadryl qhs prn  Continue N-acetylcysteine (NAC) 600 mg supplement three times per day to help with picking Start Mupirocin ointment apply to affected scabs or sores on body qd-bid    Dupixent '300mg'$ /275msq injection today to right upper arm Lot #[id number] 10/2023   Dupilumab (Dupixent) is a treatment given by injection for adults and children with moderate-to-severe atopic dermatitis. Goal is control of skin condition, not cure. It is given as 2 injections at the first dose followed by 1 injection ever 2 weeks thereafter.  Young children are dosed monthly.   Potential side effects include allergic reaction, herpes infections, injection site reactions and conjunctivitis (inflammation of the eyes).  The use of Dupixent requires long term medication management, including periodic office visits.    Topical steroids (such as triamcinolone, fluocinolone, fluocinonide, mometasone, clobetasol, halobetasol, betamethasone, hydrocortisone) can cause thinning and lightening of the skin if they are used for too long in the same area. Your physician has selected the right strength medicine for your problem and area affected on the body. Please use your medication only as directed by your physician to prevent side effects.    Related Medications mupirocin ointment (BACTROBAN) 2 % APPLY TO ANY SCABS OR SORES 1-2 TIMES DAILY AS DIRECTED.  dupilumab (DUPIXENT) prefilled syringe 300 mg  Return in about 2 weeks (around 01/04/2022) for [place] Up/Injection.  I, JiEmelia SalisburyCMA, am  acting as scribe for Sarina Ser, MD. Documentation: I have reviewed the above documentation for accuracy and completeness, and I agree with the above.  Sarina Ser, MD

## 2021-12-27 ENCOUNTER — Other Ambulatory Visit: Payer: Self-pay | Admitting: Internal Medicine

## 2021-12-29 ENCOUNTER — Encounter: Payer: Self-pay | Admitting: Nurse Practitioner

## 2021-12-29 ENCOUNTER — Ambulatory Visit (INDEPENDENT_AMBULATORY_CARE_PROVIDER_SITE_OTHER): Payer: BC Managed Care – PPO | Admitting: Nurse Practitioner

## 2021-12-29 VITALS — BP 113/68 | HR 75 | Temp 98.3°F

## 2021-12-29 DIAGNOSIS — E1169 Type 2 diabetes mellitus with other specified complication: Secondary | ICD-10-CM

## 2021-12-29 DIAGNOSIS — M549 Dorsalgia, unspecified: Secondary | ICD-10-CM

## 2021-12-29 DIAGNOSIS — R350 Frequency of micturition: Secondary | ICD-10-CM

## 2021-12-29 LAB — POCT URINALYSIS DIPSTICK
Glucose, UA: NEGATIVE
Leukocytes, UA: NEGATIVE
Nitrite, UA: NEGATIVE
Protein, UA: POSITIVE — AB
Spec Grav, UA: 1.025 (ref 1.010–1.025)
Urobilinogen, UA: NEGATIVE E.U./dL — AB
pH, UA: 5 (ref 5.0–8.0)

## 2021-12-29 LAB — POCT GLYCOSYLATED HEMOGLOBIN (HGB A1C)
HbA1c POC (<> result, manual entry): 7.8 % (ref 4.0–5.6)
HbA1c, POC (controlled diabetic range): 7.8 % — AB (ref 0.0–7.0)
HbA1c, POC (prediabetic range): 7.8 % — AB (ref 5.7–6.4)
Hemoglobin A1C: 7.8 % — AB (ref 4.0–5.6)

## 2021-12-29 NOTE — Progress Notes (Signed)
Established Patient Office Visit  Subjective:  Patient ID: Jill Holmes, female    DOB: 1955-07-14  Age: 66 y.o. MRN: [id number]  CC:  Chief Complaint  Patient presents with   Back Pain    Patient has back and flank pain and asked to do urine test for possible UTI. Denies pain with urination but has frequency.  Unsure if UTI or pulled muscle from lifting.      HPI  Jill Holmes presents for back pain and diabetes. She takes metformin 567m 3 times a day and glymiperide 4 mg daily. She does not check BS at home.   Back Pain Pertinent negatives include no chest pain.     Past Medical History:  Diagnosis Date   Cancer (Jill Holmes    thyroid ca   Diabetes mellitus without complication (Jill Holmes    Diverticulitis    Hypertension    Thyroid disease     Past Surgical History:  Procedure Laterality Date   ABDOMINAL HYSTERECTOMY     COLONOSCOPY WITH PROPOFOL N/A 02/07/2019   Procedure: COLONOSCOPY WITH PROPOFOL;  Surgeon: Jill Holmes;  Location: [facility] ENDOSCOPY;  Service: Endoscopy;  Laterality: N/A;    Family History  Problem Relation Age of Onset   Breast cancer Mother 754   Social History   Socioeconomic History   Marital status: Married    Spouse name: Not on file   Number of children: Not on file   Years of education: Not on file   Highest education level: Not on file  Occupational History   Not on file  Tobacco Use   Smoking status: Never   Smokeless tobacco: Never  Substance and Sexual Activity   Alcohol use: No   Drug use: No   Sexual activity: Yes    Birth control/protection: None  Other Topics Concern   Not on file  Social History Narrative   Not on file   Social Determinants of Health   Financial Resource Strain: Not on file  Food Insecurity: Not on file  Transportation Needs: Not on file  Physical Activity: Not on file  Stress: Not on file  Social Connections: Not on file  Intimate Partner Violence: Not on file     Outpatient Medications  Prior to Visit  Medication Sig Dispense Refill   amLODipine (NORVASC) 10 MG tablet TAKE ONE TABLET EVERY DAY 30 tablet 6   Blood Glucose Monitoring Suppl ([place] w/Device KIT See admin instructions.     clotrimazole-betamethasone (LOTRISONE) cream APPLY TOPICALLY TWICE DAILY AS DIRECTED 30 g 6   docusate sodium (COLACE) 100 MG capsule Take 1 capsule (100 mg total) by mouth 2 (two) times daily. 10 capsule 0   EPINEPHrine 0.3 mg/0.3 mL IJ SOAJ injection Inject 0.3 mg into the skin.     fexofenadine (ALLEGRA) 180 MG tablet Take 180 mg by mouth in the morning, at noon, and at bedtime.     gabapentin (NEURONTIN) 100 MG capsule TAKE TWO CAPSULES THREE TIMES A DAY 540 capsule 1   glimepiride (AMARYL) 4 MG tablet TAKE 1 TABLET BY MOUTH DAILY BEFORE BREAKFAST (Patient taking differently: Take 4 mg by mouth at bedtime.) 30 tablet 3   glucose blood test strip      lovastatin (MEVACOR) 20 MG tablet TAKE 1 TABLET BY MOUTH DAILY 90 tablet 1   meloxicam (MOBIC) 15 MG tablet TAKE 1 TABLET BY MOUTH DAILY 90 tablet 2   metoprolol succinate (TOPROL-XL) 100 MG 24 hr tablet TAKE  1 TABLET BY MOUTH DAILY 90 tablet 3   mupirocin ointment (BACTROBAN) 2 % APPLY TO ANY SCABS OR SORES 1-2 TIMES DAILY AS DIRECTED. 30 g 3   nystatin cream (MYCOSTATIN) APPLY TO AFFECTED AREAS 3 TIMES DAILY AS NEEDED     ondansetron (ZOFRAN) 4 MG tablet Take 1 tablet (4 mg total) by mouth daily as needed for nausea or vomiting. 20 tablet 1   polyethylene glycol powder (GLYCOLAX/MIRALAX) 17 GM/SCOOP powder Take 17 g by mouth 2 (two) times daily as needed. 3350 g 1   triamcinolone cream (KENALOG) 0.1 % APPLY TOPICALLY EVERY DAY. AVOID FACE, GROIN, UNDERARMS. 453.6 g 0   triamcinolone cream (KENALOG) 0.1 % Apply 1 Application topically 2 (two) times daily as needed. 454 g 2   levothyroxine (SYNTHROID) 150 MCG tablet TAKE 1 TABLET BY MOUTH DAILY 90 tablet 3   metFORMIN (GLUCOPHAGE-XR) 500 MG 24 hr tablet TAKE 1 TABLET BY MOUTH  3 TIMES DAILY 90 tablet 1   glimepiride (AMARYL) 2 MG tablet TAKE 2 TABLETS BY MOUTH AT NIGHTTIME (Patient not taking: Reported on 12/29/2021) 60 tablet 3   metFORMIN (GLUCOPHAGE) 500 MG tablet Take 1 tablet (500 mg total) by mouth 2 (two) times daily with a meal. 180 tablet 3   metoprolol succinate (TOPROL-XL) 50 MG 24 hr tablet metoprolol succinate ER 50 mg tablet,extended release 24 hr     Semaglutide (RYBELSUS) 3 MG TABS Take by mouth.     Facility-Administered Medications Prior to Visit  Medication Dose Route Frequency Provider Last Rate Last Admin   betamethasone acetate-betamethasone sodium phosphate (CELESTONE) injection 3 mg  3 mg Intramuscular Once Daylene Katayama M, DPM       betamethasone acetate-betamethasone sodium phosphate (CELESTONE) injection 3 mg  3 mg Intramuscular Once Daylene Katayama M, DPM       betamethasone acetate-betamethasone sodium phosphate (CELESTONE) injection 3 mg  3 mg Intra-articular Once Daylene Katayama M, DPM       betamethasone acetate-betamethasone sodium phosphate (CELESTONE) injection 3 mg  3 mg Intra-articular Once Jill Holmes, DPM        Allergies  Allergen Reactions   Elemental Sulfur Anaphylaxis   Enalapril Anaphylaxis   Lisinopril    Sulfa Antibiotics     ROS Review of Systems  Constitutional: Negative.   HENT: Negative.    Respiratory:  Negative for chest tightness and shortness of breath.   Cardiovascular:  Negative for chest pain and palpitations.  Gastrointestinal: Negative.   Genitourinary: Negative.   Musculoskeletal:  Positive for back pain.  Skin: Negative.   Psychiatric/Behavioral:  Negative for agitation, behavioral problems and confusion.       Objective:    Physical Exam Constitutional:      Appearance: Normal appearance. She is obese.  HENT:     Head: Normocephalic.     Right Ear: Tympanic membrane normal.     Left Ear: Tympanic membrane normal.     Nose: Nose normal.     Mouth/Throat:     Mouth: Mucous membranes are  moist.     Pharynx: Oropharynx is clear.  Eyes:     Extraocular Movements: Extraocular movements intact.     Conjunctiva/sclera: Conjunctivae normal.     Pupils: Pupils are equal, round, and reactive to light.  Cardiovascular:     Rate and Rhythm: Normal rate and regular rhythm.     Pulses: Normal pulses.     Heart sounds: Normal heart sounds.  Pulmonary:     Effort: Pulmonary effort is  normal. No respiratory distress.     Breath sounds: Normal breath sounds. No rhonchi.  Abdominal:     General: Bowel sounds are normal.     Palpations: Abdomen is soft. There is no mass.     Tenderness: There is no abdominal tenderness.     Hernia: No hernia is present.  Musculoskeletal:        General: Normal range of motion.     Cervical back: Neck supple. No tenderness.  Skin:    General: Skin is warm.     Capillary Refill: Capillary refill takes less than 2 seconds.  Neurological:     General: No focal deficit present.     Mental Status: She is alert and oriented to person, place, and time. Mental status is at baseline.  Psychiatric:        Mood and Affect: Mood normal.        Behavior: Behavior normal.        Thought Content: Thought content normal.        Judgment: Judgment normal.     BP 113/68   Pulse 75   Temp 98.3 F (36.8 C) (Temporal)   SpO2 96%  Wt Readings from Last 3 Encounters:  08/09/21 229 lb 1.6 oz (103.9 kg)  08/02/21 227 lb 11.2 oz (103.3 kg)  09/10/20 229 lb (103.9 kg)     Health Maintenance  Topic Date Due   COVID-19 Vaccine (1) Never done   HIV Screening  Never done   Diabetic kidney evaluation - Urine ACR  Never done   Hepatitis C Screening  Never done   DTaP/Tdap/Td (1 - Tdap) Never done   FOOT EXAM  08/20/2021   INFLUENZA VACCINE  09/20/2021   OPHTHALMOLOGY EXAM  12/06/2021   PAP SMEAR-Modifier  12/26/2021   Zoster Vaccines- Shingrix (1 of 2) 03/31/2022 (Originally 03/27/1974)   COLONOSCOPY (Pts 45-79yr Insurance coverage will need to be confirmed)   02/06/2022   HEMOGLOBIN A1C  06/29/2022   Diabetic kidney evaluation - eGFR measurement  08/03/2022   MAMMOGRAM  06/09/2023   HPV VACCINES  Aged Out    There are no preventive care reminders to display for this patient.  Lab Results  Component Value Date   TSH 0.83 08/02/2021   Lab Results  Component Value Date   WBC 5.6 08/02/2021   HGB 11.5 (L) 08/02/2021   HCT 36.9 08/02/2021   MCV 81.5 08/02/2021   PLT 299 08/02/2021   Lab Results  Component Value Date   NA 138 08/02/2021   K 4.2 08/02/2021   CO2 26 08/02/2021   GLUCOSE 161 (H) 08/02/2021   BUN 14 08/02/2021   CREATININE 0.74 08/02/2021   BILITOT 0.7 08/02/2021   ALKPHOS 48 10/26/2014   AST 24 08/02/2021   ALT 22 08/02/2021   PROT 7.4 08/02/2021   ALBUMIN 4.0 10/26/2014   CALCIUM 9.4 08/02/2021   ANIONGAP 9 10/26/2014   EGFR 90 08/02/2021   Lab Results  Component Value Date   CHOL 178 08/02/2021   Lab Results  Component Value Date   HDL 54 08/02/2021   Lab Results  Component Value Date   LDLCALC 90 08/02/2021   Lab Results  Component Value Date   TRIG 244 (H) 08/02/2021   Lab Results  Component Value Date   CHOLHDL 3.3 08/02/2021   Lab Results  Component Value Date   HGBA1C 7.8 (A) 12/29/2021   HGBA1C 7.8 12/29/2021   HGBA1C 7.8 (A) 12/29/2021   HGBA1C 7.8 (  A) 12/29/2021      Assessment & Plan:   Problem List Items Addressed This Visit       Endocrine   Diabetes mellitus ([place])    Patient hemoglobin A1c 7.8 in the office today. Advised pt to check the BS regularly, make a log and bring to next appointment.  Advised pt to eat variety of food including fruits, vegetables, whole grains, complex carbohydrates and proteins.        Relevant Orders   POCT glycosylated hemoglobin (Hb A1C) (Completed)     Other   Back pain - Primary    Encouraged her to perform range of motion exercises. Alternate hot and cold therapy. Continue meloxicam      Relevant Orders   POCT urinalysis  dipstick (Completed)   Urinary frequency    Urinalysis negative for leukocytes. Encourage patient to increase hydration.      Relevant Orders   POCT urinalysis dipstick (Completed)     No orders of the defined types were placed in this encounter.    Follow-up: No follow-ups on file.    Theresia Lo, NP

## 2022-01-05 ENCOUNTER — Ambulatory Visit: Payer: BC Managed Care – PPO | Admitting: Dermatology

## 2022-01-05 DIAGNOSIS — L281 Prurigo nodularis: Secondary | ICD-10-CM

## 2022-01-05 DIAGNOSIS — Z79899 Other long term (current) drug therapy: Secondary | ICD-10-CM | POA: Diagnosis not present

## 2022-01-05 DIAGNOSIS — L2081 Atopic neurodermatitis: Secondary | ICD-10-CM

## 2022-01-05 MED ORDER — DUPIXENT 300 MG/2ML ~~LOC~~ SOAJ: 300.0000 mg | SUBCUTANEOUS | 0 refills | Status: DC

## 2022-01-05 MED ORDER — ADBRY 150 MG/ML ~~LOC~~ SOSY: 300.0000 mg | PREFILLED_SYRINGE | SUBCUTANEOUS | 3 refills | Status: DC

## 2022-01-05 NOTE — Patient Instructions (Signed)
Due to recent changes in healthcare laws, you may see results of your pathology and/or laboratory studies on MyChart before the doctors have had a chance to review them. We understand that in some cases there may be results that are confusing or concerning to you. Please understand that not all results are received at the same time and often the doctors may need to interpret multiple results in order to provide you with the best plan of care or course of treatment. Therefore, we ask that you please give us 2 business days to thoroughly review all your results before contacting the office for clarification. Should we see a critical lab result, you will be contacted sooner.   If You Need Anything After Your Visit  If you have any questions or concerns for your doctor, please call our main line at [phone] and press option 4 to reach your doctor's medical assistant. If no one answers, please leave a voicemail as directed and we will return your call as soon as possible. Messages left after 4 pm will be answered the following business day.   You may also send us a message via MyChart. We typically respond to MyChart messages within 1-2 business days.  For prescription refills, please ask your pharmacy to contact our office. Our fax number is [phone].  If you have an urgent issue when the clinic is closed that cannot wait until the next business day, you can page your doctor at the number below.    Please note that while we do our best to be available for urgent issues outside of office hours, we are not available 24/7.   If you have an urgent issue and are unable to reach us, you may choose to seek medical care at your doctor's office, retail clinic, urgent care center, or emergency room.  If you have a medical emergency, please immediately call 911 or go to the emergency department.  Pager Numbers  - Dr. Kowalski: [phone]  - Dr. Moye: [phone]  - Dr. Stewart:  [phone]  In the event of inclement weather, please call our main line at [phone] for an update on the status of any delays or closures.  Dermatology Medication Tips: Please keep the boxes that topical medications come in in order to help keep track of the instructions about where and how to use these. Pharmacies typically print the medication instructions only on the boxes and not directly on the medication tubes.   If your medication is too expensive, please contact our office at [phone] option 4 or send us a message through MyChart.   We are unable to tell what your co-pay for medications will be in advance as this is different depending on your insurance coverage. However, we may be able to find a substitute medication at lower cost or fill out paperwork to get insurance to cover a needed medication.   If a prior authorization is required to get your medication covered by your insurance company, please allow us 1-2 business days to complete this process.  Drug prices often vary depending on where the prescription is filled and some pharmacies may offer cheaper prices.  The website www.goodrx.com contains coupons for medications through different pharmacies. The prices here do not account for what the cost may be with help from insurance (it may be cheaper with your insurance), but the website can give you the price if you did not use any insurance.  - You can print the associated coupon and take it with   your prescription to the pharmacy.  - You may also stop by our office during regular business hours and pick up a GoodRx coupon card.  - If you need your prescription sent electronically to a different pharmacy, notify our office through [place] MyChart or by phone at [phone] option 4.     Si Usted Necesita Algo Despus de Su Visita  Tambin puede enviarnos un mensaje a travs de MyChart. Por lo general respondemos a los mensajes de MyChart en el transcurso de 1 a 2  das hbiles.  Para renovar recetas, por favor pida a su farmacia que se ponga en contacto con nuestra oficina. Nuestro nmero de fax es el [phone].  Si tiene un asunto urgente cuando la clnica est cerrada y que no puede esperar hasta el siguiente da hbil, puede llamar/localizar a su doctor(a) al nmero que aparece a continuacin.   Por favor, tenga en cuenta que aunque hacemos todo lo posible para estar disponibles para asuntos urgentes fuera del horario de oficina, no estamos disponibles las 24 horas del da, los 7 das de la semana.   Si tiene un problema urgente y no puede comunicarse con nosotros, puede optar por buscar atencin mdica  en el consultorio de su doctor(a), en una clnica privada, en un centro de atencin urgente o en una sala de emergencias.  Si tiene una emergencia mdica, por favor llame inmediatamente al 911 o vaya a la sala de emergencias.  Nmeros de bper  - Dr. Kowalski: [phone]  - Dra. Moye: [phone]  - Dra. Stewart: [phone]  En caso de inclemencias del tiempo, por favor llame a nuestra lnea principal al [phone] para una actualizacin sobre el estado de cualquier retraso o cierre.  Consejos para la medicacin en dermatologa: Por favor, guarde las cajas en las que vienen los medicamentos de uso tpico para ayudarle a seguir las instrucciones sobre dnde y cmo usarlos. Las farmacias generalmente imprimen las instrucciones del medicamento slo en las cajas y no directamente en los tubos del medicamento.   Si su medicamento es muy caro, por favor, pngase en contacto con nuestra oficina llamando al [phone] y presione la opcin 4 o envenos un mensaje a travs de MyChart.   No podemos decirle cul ser su copago por los medicamentos por adelantado ya que esto es diferente dependiendo de la cobertura de su seguro. Sin embargo, es posible que podamos encontrar un medicamento sustituto a menor costo o llenar un formulario para que el  seguro cubra el medicamento que se considera necesario.   Si se requiere una autorizacin previa para que su compaa de seguros cubra su medicamento, por favor permtanos de 1 a 2 das hbiles para completar este proceso.  Los precios de los medicamentos varan con frecuencia dependiendo del lugar de dnde se surte la receta y alguna farmacias pueden ofrecer precios ms baratos.  El sitio web www.goodrx.com tiene cupones para medicamentos de diferentes farmacias. Los precios aqu no tienen en cuenta lo que podra costar con la ayuda del seguro (puede ser ms barato con su seguro), pero el sitio web puede darle el precio si no utiliz ningn seguro.  - Puede imprimir el cupn correspondiente y llevarlo con su receta a la farmacia.  - Tambin puede pasar por nuestra oficina durante el horario de atencin regular y recoger una tarjeta de cupones de GoodRx.  - Si necesita que su receta se enve electrnicamente a una farmacia diferente, informe a nuestra oficina a travs de MyChart de [facility]   o por telfono llamando al [phone] y presione la opcin 4.  

## 2022-01-06 ENCOUNTER — Encounter: Payer: Self-pay | Admitting: Dermatology

## 2022-01-07 ENCOUNTER — Other Ambulatory Visit: Payer: Self-pay | Admitting: Internal Medicine

## 2022-01-09 ENCOUNTER — Other Ambulatory Visit: Payer: Self-pay | Admitting: Internal Medicine

## 2022-01-13 ENCOUNTER — Other Ambulatory Visit: Payer: Self-pay | Admitting: Internal Medicine

## 2022-01-16 NOTE — Progress Notes (Signed)
Follow-Up Visit   Subjective  Jill Holmes is a 66 y.o. female who presents for the following: Follow-up (Atopic neurodermatitis follow up - Dupixent injection today).  The following portions of the chart were reviewed this encounter and updated as appropriate:   Tobacco  Allergies  Meds  Problems  Med Hx  Surg Hx  Fam Hx     Review of Systems:  No other skin or systemic complaints except as noted in HPI or Assessment and Plan.  Objective  Well appearing patient in no apparent distress; mood and affect are within normal limits.  A focused examination was performed including face, arms, legs. Relevant physical exam findings are noted in the Assessment and Plan.   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 Condition is symptomatic / bothersome to patient. Not to goal.  will plan to start Adbry on follow up (will plan to start with 2 injections - no loading dose)   Atopic dermatitis - Severe, on Dupixent (biologic medication).  Atopic dermatitis (eczema) is a chronic, relapsing, pruritic condition that can significantly affect quality of life. It is often associated with allergic rhinitis and/or asthma and can require treatment with topical medications, phototherapy, or in severe cases a biologic medication called Dupixent, which requires long term medication management.     Chronic and persistent condition with duration or expected duration over one year. Condition is symptomatic / bothersome to patient. Not to goal.  Dupixent started 09/26/2021.    Cont Dupixent '300mg'$ /69m sq injections q 2 wks, If not seeing any improvement recommend starting Adbry injections. Will plan to use samples for loading dose. [facility] Cont TMC 0.1% cr qd up to 5d/wk aa body prn flares, avoid f/g/a Cont Allegra  qam Cont Benadryl qhs prn  Continue N-acetylcysteine (NAC) 600 mg supplement three times per day to help with picking Start Mupirocin ointment apply to affected scabs or sores on body qd-bid    Dupixent '300mg'$ /224msq injection today to right upper arm Lot #[id number] 10/2023   Dupilumab (Dupixent) is a treatment given by injection for adults and children with moderate-to-severe atopic dermatitis. Goal is control of skin condition, not cure. It is given as 2 injections at the first dose followed by 1 injection ever 2 weeks thereafter.  Young children are dosed monthly.   Potential side effects include allergic reaction, herpes infections, injection site reactions and conjunctivitis (inflammation of the eyes).  The use of Dupixent requires long term medication management, including periodic office visits.    Topical steroids (such as triamcinolone, fluocinolone, fluocinonide, mometasone, clobetasol, halobetasol, betamethasone, hydrocortisone) can cause thinning and lightening of the skin if they are used for too long in the same area. Your physician has selected the right strength medicine for your problem and area affected on the body. Please use your medication only as directed by your physician to prevent side effects.     Dupilumab (DUPIXENT) 300 MG/2ML SOPN Inject 300 mg into the skin every 14 (fourteen) days. Starting at day 15 for maintenance. Tralokinumab-ldrm (ADBRY) 150 MG/ML SOSY Inject 2 mLs (300 mg total) into the skin every 14 (fourteen) days. Starting on day 15 for maintenance. Related Medications mupirocin ointment (BACTROBAN) 2 % APPLY TO ANY SCABS OR SORES 1-2 TIMES DAILY AS DIRECTED.  Return in about 2 weeks (around 01/19/2022) for Atopic Dermatitis.  I,  Ashok Cordia, CMA, am acting as scribe for Sarina Ser, MD . Documentation: I have reviewed the above documentation for accuracy and completeness, and I agree with the above.  Sarina Ser, MD

## 2022-01-19 ENCOUNTER — Ambulatory Visit: Payer: BC Managed Care – PPO | Admitting: Dermatology

## 2022-01-19 VITALS — BP 121/77 | HR 73

## 2022-01-19 DIAGNOSIS — L299 Pruritus, unspecified: Secondary | ICD-10-CM

## 2022-01-19 DIAGNOSIS — L281 Prurigo nodularis: Secondary | ICD-10-CM

## 2022-01-19 DIAGNOSIS — Z79899 Other long term (current) drug therapy: Secondary | ICD-10-CM | POA: Diagnosis not present

## 2022-01-19 DIAGNOSIS — L2081 Atopic neurodermatitis: Secondary | ICD-10-CM | POA: Diagnosis not present

## 2022-01-19 MED ORDER — ADBRY 150 MG/ML ~~LOC~~ SOSY: 300.0000 mg | PREFILLED_SYRINGE | SUBCUTANEOUS | 0 refills | Status: DC

## 2022-01-19 NOTE — Progress Notes (Signed)
   Follow-Up Visit   Subjective  Jill Holmes is a 66 y.o. female who presents for the following: Eczema (Arms, legs, 2wk f/u, pt currently on Dupixent '300mg'$ /28m sq injections, TMC 0.1% cr qd 5d/wk, Allegra '180mg'$  1 po qam, Benadryl qhs prn, N-acetylcysteine (NAC) '600mg'$  3x/day, Mupirocin oint qd to open sores, [facility]  The following portions of the chart were reviewed this encounter and updated as appropriate:   Tobacco  Allergies  Meds  Problems  Med Hx  Surg Hx  Fam Hx     Review of Systems:  No other skin or systemic complaints except as noted in HPI or Assessment and Plan.  Objective  Well appearing patient in no apparent distress; mood and affect are within normal limits.  A focused examination was performed including lower legs. Relevant physical exam findings are noted in the Assessment and Plan.  lower legs Crusts of lower legs   Assessment & Plan  Atopic neurodermatitis lower legs  With pruritus and excoriations and Prurigo nodularis - Starting Adbry today -  (loading dose)   Atopic dermatitis - Severe, currently on Dupixent last injection 01/05/22, [facility]  Atopic dermatitis (eczema) is a chronic, relapsing, pruritic condition that can significantly affect quality of life. It is often associated with allergic rhinitis and/or asthma and can require treatment with topical medications, phototherapy, or in severe cases a biologic medication called Dupixent, which requires long term medication management.     Chronic and persistent condition with duration or expected duration over one year. Condition is symptomatic / bothersome to patient. Not to goal.  Dupixent started 09/26/2021.    Start Adbry '150mg'$ /ml x 2 sq injections, Adbry '150mg'$ /ml sq injections x 2 to L upper arm Cont TMC 0.1% cr qd up to 5d/wk aa body prn flares, avoid f/g/a Cont Allegra bid Cont Benadryl qhs prn  Continue N-acetylcysteine (NAC) 600 mg supplement three  times per day to help with picking Cont Mupirocin ointment apply to affected scabs or sores on body qd-bid     Tralokinumab-ldrm (ADBRY) 150 MG/ML SOSY - lower legs Inject 2 mLs (300 mg total) into the skin every 14 (fourteen) days. Starting on day 15 for maintenance.  Related Medications mupirocin ointment (BACTROBAN) 2 % APPLY TO ANY SCABS OR SORES 1-2 TIMES DAILY AS DIRECTED.  Dupilumab (DUPIXENT) 300 MG/2ML SOPN Inject 300 mg into the skin every 14 (fourteen) days. Starting at day 15 for maintenance.  Tralokinumab-ldrm (ADBRY) 150 MG/ML SOSY Inject 2 mLs (300 mg total) into the skin every 14 (fourteen) days. Starting on day 15 for maintenance.   Return in about 2 weeks (around 02/02/2022).  I, SOthelia Pulling RMA, am acting as scribe for DSarina Ser MD . Documentation: I have reviewed the above documentation for accuracy and completeness, and I agree with the above.  DSarina Ser MD

## 2022-01-19 NOTE — Patient Instructions (Signed)
Due to recent changes in healthcare laws, you may see results of your pathology and/or laboratory studies on MyChart before the doctors have had a chance to review them. We understand that in some cases there may be results that are confusing or concerning to you. Please understand that not all results are received at the same time and often the doctors may need to interpret multiple results in order to provide you with the best plan of care or course of treatment. Therefore, we ask that you please give us 2 business days to thoroughly review all your results before contacting the office for clarification. Should we see a critical lab result, you will be contacted sooner.   If You Need Anything After Your Visit  If you have any questions or concerns for your doctor, please call our main line at [phone] and press option 4 to reach your doctor's medical assistant. If no one answers, please leave a voicemail as directed and we will return your call as soon as possible. Messages left after 4 pm will be answered the following business day.   You may also send us a message via MyChart. We typically respond to MyChart messages within 1-2 business days.  For prescription refills, please ask your pharmacy to contact our office. Our fax number is [phone].  If you have an urgent issue when the clinic is closed that cannot wait until the next business day, you can page your doctor at the number below.    Please note that while we do our best to be available for urgent issues outside of office hours, we are not available 24/7.   If you have an urgent issue and are unable to reach us, you may choose to seek medical care at your doctor's office, retail clinic, urgent care center, or emergency room.  If you have a medical emergency, please immediately call 911 or go to the emergency department.  Pager Numbers  - Dr. Kowalski: [phone]  - Dr. Moye: [phone]  - Dr. Stewart:  [phone]  In the event of inclement weather, please call our main line at [phone] for an update on the status of any delays or closures.  Dermatology Medication Tips: Please keep the boxes that topical medications come in in order to help keep track of the instructions about where and how to use these. Pharmacies typically print the medication instructions only on the boxes and not directly on the medication tubes.   If your medication is too expensive, please contact our office at [phone] option 4 or send us a message through MyChart.   We are unable to tell what your co-pay for medications will be in advance as this is different depending on your insurance coverage. However, we may be able to find a substitute medication at lower cost or fill out paperwork to get insurance to cover a needed medication.   If a prior authorization is required to get your medication covered by your insurance company, please allow us 1-2 business days to complete this process.  Drug prices often vary depending on where the prescription is filled and some pharmacies may offer cheaper prices.  The website www.goodrx.com contains coupons for medications through different pharmacies. The prices here do not account for what the cost may be with help from insurance (it may be cheaper with your insurance), but the website can give you the price if you did not use any insurance.  - You can print the associated coupon and take it with   your prescription to the pharmacy.  - You may also stop by our office during regular business hours and pick up a GoodRx coupon card.  - If you need your prescription sent electronically to a different pharmacy, notify our office through [facility] MyChart or by phone at [phone] option 4.     Si Usted Necesita Algo Despus de Su Visita  Tambin puede enviarnos un mensaje a travs de MyChart. Por lo general respondemos a los mensajes de MyChart en el transcurso de 1 a 2  das hbiles.  Para renovar recetas, por favor pida a su farmacia que se ponga en contacto con nuestra oficina. Nuestro nmero de fax es el [phone].  Si tiene un asunto urgente cuando la clnica est cerrada y que no puede esperar hasta el siguiente da hbil, puede llamar/localizar a su doctor(a) al nmero que aparece a continuacin.   Por favor, tenga en cuenta que aunque hacemos todo lo posible para estar disponibles para asuntos urgentes fuera del horario de oficina, no estamos disponibles las 24 horas del da, los 7 das de la semana.   Si tiene un problema urgente y no puede comunicarse con nosotros, puede optar por buscar atencin mdica  en el consultorio de su doctor(a), en una clnica privada, en un centro de atencin urgente o en una sala de emergencias.  Si tiene una emergencia mdica, por favor llame inmediatamente al 911 o vaya a la sala de emergencias.  Nmeros de bper  - Dr. Kowalski: [phone]  - Dra. Moye: [phone]  - Dra. Stewart: [phone]  En caso de inclemencias del tiempo, por favor llame a nuestra lnea principal al [phone] para una actualizacin sobre el estado de cualquier retraso o cierre.  Consejos para la medicacin en dermatologa: Por favor, guarde las cajas en las que vienen los medicamentos de uso tpico para ayudarle a seguir las instrucciones sobre dnde y cmo usarlos. Las farmacias generalmente imprimen las instrucciones del medicamento slo en las cajas y no directamente en los tubos del medicamento.   Si su medicamento es muy caro, por favor, pngase en contacto con nuestra oficina llamando al [phone] y presione la opcin 4 o envenos un mensaje a travs de MyChart.   No podemos decirle cul ser su copago por los medicamentos por adelantado ya que esto es diferente dependiendo de la cobertura de su seguro. Sin embargo, es posible que podamos encontrar un medicamento sustituto a menor costo o llenar un formulario para que el  seguro cubra el medicamento que se considera necesario.   Si se requiere una autorizacin previa para que su compaa de seguros cubra su medicamento, por favor permtanos de 1 a 2 das hbiles para completar este proceso.  Los precios de los medicamentos varan con frecuencia dependiendo del lugar de dnde se surte la receta y alguna farmacias pueden ofrecer precios ms baratos.  El sitio web www.goodrx.com tiene cupones para medicamentos de diferentes farmacias. Los precios aqu no tienen en cuenta lo que podra costar con la ayuda del seguro (puede ser ms barato con su seguro), pero el sitio web puede darle el precio si no utiliz ningn seguro.  - Puede imprimir el cupn correspondiente y llevarlo con su receta a la farmacia.  - Tambin puede pasar por nuestra oficina durante el horario de atencin regular y recoger una tarjeta de cupones de GoodRx.  - Si necesita que su receta se enve electrnicamente a una farmacia diferente, informe a nuestra oficina a travs de MyChart de [facility]   o por telfono llamando al [phone] y presione la opcin 4.  

## 2022-01-24 ENCOUNTER — Encounter: Payer: Self-pay | Admitting: Dermatology

## 2022-02-02 ENCOUNTER — Ambulatory Visit: Payer: BC Managed Care – PPO | Admitting: Dermatology

## 2022-02-02 VITALS — BP 137/69 | HR 69

## 2022-02-02 DIAGNOSIS — L299 Pruritus, unspecified: Secondary | ICD-10-CM | POA: Diagnosis not present

## 2022-02-02 DIAGNOSIS — Z79899 Other long term (current) drug therapy: Secondary | ICD-10-CM | POA: Diagnosis not present

## 2022-02-02 DIAGNOSIS — L2081 Atopic neurodermatitis: Secondary | ICD-10-CM | POA: Diagnosis not present

## 2022-02-02 DIAGNOSIS — T07XXXA Unspecified multiple injuries, initial encounter: Secondary | ICD-10-CM | POA: Diagnosis not present

## 2022-02-02 DIAGNOSIS — M549 Dorsalgia, unspecified: Secondary | ICD-10-CM | POA: Insufficient documentation

## 2022-02-02 DIAGNOSIS — R350 Frequency of micturition: Secondary | ICD-10-CM | POA: Insufficient documentation

## 2022-02-02 MED ORDER — ADBRY 150 MG/ML ~~LOC~~ SOSY: 300.0000 mg | PREFILLED_SYRINGE | SUBCUTANEOUS | 0 refills | Status: DC

## 2022-02-02 NOTE — Assessment & Plan Note (Signed)
Encouraged her to perform range of motion exercises. Alternate hot and cold therapy. Continue meloxicam

## 2022-02-02 NOTE — Assessment & Plan Note (Signed)
Patient hemoglobin A1c 7.8 in the office today. Advised pt to check the BS regularly, make a log and bring to next appointment.  Advised pt to eat variety of food including fruits, vegetables, whole grains, complex carbohydrates and proteins.

## 2022-02-02 NOTE — Progress Notes (Signed)
   Follow-Up Visit   Subjective  Jill Holmes is a 66 y.o. female who presents for the following: Eczema (Lower legs, 2 wk f/u, Adbry q 2 wks, TMC 0.1% cr qd prn flares/itching, mupirocin oint qd to open sores, Allegra '180mg'$  1 po qd, Benadryl qhs prn itching, NAC '600mg'$  3x/day).  The following portions of the chart were reviewed this encounter and updated as appropriate:   Tobacco  Allergies  Meds  Problems  Med Hx  Surg Hx  Fam Hx     Review of Systems:  No other skin or systemic complaints except as noted in HPI or Assessment and Plan.  Objective  Well appearing patient in no apparent distress; mood and affect are within normal limits.  A focused examination was performed including arms, legs. Relevant physical exam findings are noted in the Assessment and Plan.  legs, arms Excoriations bil lower legs   Assessment & Plan  Atopic neurodermatitis -legs, arms Severe with pruritus and excoriations and Prurigo nodularis -currently on Adbry  started Adbry on 01/19/2022   Atopic dermatitis (eczema) is a chronic, relapsing, pruritic condition that can significantly affect quality of life. It is often associated with allergic rhinitis and/or asthma and can require treatment with topical medications, phototherapy, or in severe cases a biologic medication called Dupixent, which requires long term medication management.     Chronic and persistent condition with duration or expected duration over one year. Condition is symptomatic / bothersome to patient. Not to goal.  Dupixent started 09/26/2021.   Cont Adbry '150mg'$ /ml x 2 sq injections q 2 wks, Adbry '150mg'$ /ml sq injections x 2 to R upper arm, samples x 2 lot [id number] exp 10/2022 Cont TMC 0.1% cr qd up to 5d/wk aa body prn flares, avoid f/g/a Cont Allegra bid Cont Benadryl qhs prn  Continue N-acetylcysteine (NAC) 600 mg supplement three times per day to help with picking Cont Mupirocin ointment apply to affected scabs or sores on body  qd-bid    Topical steroids (such as triamcinolone, fluocinolone, fluocinonide, mometasone, clobetasol, halobetasol, betamethasone, hydrocortisone) can cause thinning and lightening of the skin if they are used for too long in the same area. Your physician has selected the right strength medicine for your problem and area affected on the body. Please use your medication only as directed by your physician to prevent side effects.    Tralokinumab-ldrm (ADBRY) 150 MG/ML SOSY - legs, arms Inject 2 mLs (300 mg total) into the skin every 14 (fourteen) days. Starting on day 15 for maintenance.  Related Medications mupirocin ointment (BACTROBAN) 2 % APPLY TO ANY SCABS OR SORES 1-2 TIMES DAILY AS DIRECTED.  Dupilumab (DUPIXENT) 300 MG/2ML SOPN Inject 300 mg into the skin every 14 (fourteen) days. Starting at day 15 for maintenance.  Tralokinumab-ldrm (ADBRY) 150 MG/ML SOSY Inject 2 mLs (300 mg total) into the skin every 14 (fourteen) days. Starting on day 15 for maintenance.  Tralokinumab-ldrm (ADBRY) 150 MG/ML SOSY Inject 2 mLs (300 mg total) into the skin every 14 (fourteen) days. Starting on day 15 for maintenance.  Return in about 2 weeks (around 02/16/2022) for Atopic Derm with nurse, 4 wks with nurse, and 6 wks with Dr. Dara Lords, Othelia Pulling, RMA, am acting as scribe for Sarina Ser, MD . Documentation: I have reviewed the above documentation for accuracy and completeness, and I agree with the above.  Sarina Ser, MD

## 2022-02-02 NOTE — Patient Instructions (Signed)
Due to recent changes in healthcare laws, you may see results of your pathology and/or laboratory studies on MyChart before the doctors have had a chance to review them. We understand that in some cases there may be results that are confusing or concerning to you. Please understand that not all results are received at the same time and often the doctors may need to interpret multiple results in order to provide you with the best plan of care or course of treatment. Therefore, we ask that you please give us 2 business days to thoroughly review all your results before contacting the office for clarification. Should we see a critical lab result, you will be contacted sooner.   If You Need Anything After Your Visit  If you have any questions or concerns for your doctor, please call our main line at [phone] and press option 4 to reach your doctor's medical assistant. If no one answers, please leave a voicemail as directed and we will return your call as soon as possible. Messages left after 4 pm will be answered the following business day.   You may also send us a message via MyChart. We typically respond to MyChart messages within 1-2 business days.  For prescription refills, please ask your pharmacy to contact our office. Our fax number is [phone].  If you have an urgent issue when the clinic is closed that cannot wait until the next business day, you can page your doctor at the number below.    Please note that while we do our best to be available for urgent issues outside of office hours, we are not available 24/7.   If you have an urgent issue and are unable to reach us, you may choose to seek medical care at your doctor's office, retail clinic, urgent care center, or emergency room.  If you have a medical emergency, please immediately call 911 or go to the emergency department.  Pager Numbers  - Dr. Kowalski: [phone]  - Dr. Moye: [phone]  - Dr. Stewart:  [phone]  In the event of inclement weather, please call our main line at [phone] for an update on the status of any delays or closures.  Dermatology Medication Tips: Please keep the boxes that topical medications come in in order to help keep track of the instructions about where and how to use these. Pharmacies typically print the medication instructions only on the boxes and not directly on the medication tubes.   If your medication is too expensive, please contact our office at [phone] option 4 or send us a message through MyChart.   We are unable to tell what your co-pay for medications will be in advance as this is different depending on your insurance coverage. However, we may be able to find a substitute medication at lower cost or fill out paperwork to get insurance to cover a needed medication.   If a prior authorization is required to get your medication covered by your insurance company, please allow us 1-2 business days to complete this process.  Drug prices often vary depending on where the prescription is filled and some pharmacies may offer cheaper prices.  The website www.goodrx.com contains coupons for medications through different pharmacies. The prices here do not account for what the cost may be with help from insurance (it may be cheaper with your insurance), but the website can give you the price if you did not use any insurance.  - You can print the associated coupon and take it with   your prescription to the pharmacy.  - You may also stop by our office during regular business hours and pick up a GoodRx coupon card.  - If you need your prescription sent electronically to a different pharmacy, notify our office through [facility] MyChart or by phone at [phone] option 4.     Si Usted Necesita Algo Despus de Su Visita  Tambin puede enviarnos un mensaje a travs de MyChart. Por lo general respondemos a los mensajes de MyChart en el transcurso de 1 a 2  das hbiles.  Para renovar recetas, por favor pida a su farmacia que se ponga en contacto con nuestra oficina. Nuestro nmero de fax es el [phone].  Si tiene un asunto urgente cuando la clnica est cerrada y que no puede esperar hasta el siguiente da hbil, puede llamar/localizar a su doctor(a) al nmero que aparece a continuacin.   Por favor, tenga en cuenta que aunque hacemos todo lo posible para estar disponibles para asuntos urgentes fuera del horario de oficina, no estamos disponibles las 24 horas del da, los 7 das de la semana.   Si tiene un problema urgente y no puede comunicarse con nosotros, puede optar por buscar atencin mdica  en el consultorio de su doctor(a), en una clnica privada, en un centro de atencin urgente o en una sala de emergencias.  Si tiene una emergencia mdica, por favor llame inmediatamente al 911 o vaya a la sala de emergencias.  Nmeros de bper  - Dr. Kowalski: [phone]  - Dra. Moye: [phone]  - Dra. Stewart: [phone]  En caso de inclemencias del tiempo, por favor llame a nuestra lnea principal al [phone] para una actualizacin sobre el estado de cualquier retraso o cierre.  Consejos para la medicacin en dermatologa: Por favor, guarde las cajas en las que vienen los medicamentos de uso tpico para ayudarle a seguir las instrucciones sobre dnde y cmo usarlos. Las farmacias generalmente imprimen las instrucciones del medicamento slo en las cajas y no directamente en los tubos del medicamento.   Si su medicamento es muy caro, por favor, pngase en contacto con nuestra oficina llamando al [phone] y presione la opcin 4 o envenos un mensaje a travs de MyChart.   No podemos decirle cul ser su copago por los medicamentos por adelantado ya que esto es diferente dependiendo de la cobertura de su seguro. Sin embargo, es posible que podamos encontrar un medicamento sustituto a menor costo o llenar un formulario para que el  seguro cubra el medicamento que se considera necesario.   Si se requiere una autorizacin previa para que su compaa de seguros cubra su medicamento, por favor permtanos de 1 a 2 das hbiles para completar este proceso.  Los precios de los medicamentos varan con frecuencia dependiendo del lugar de dnde se surte la receta y alguna farmacias pueden ofrecer precios ms baratos.  El sitio web www.goodrx.com tiene cupones para medicamentos de diferentes farmacias. Los precios aqu no tienen en cuenta lo que podra costar con la ayuda del seguro (puede ser ms barato con su seguro), pero el sitio web puede darle el precio si no utiliz ningn seguro.  - Puede imprimir el cupn correspondiente y llevarlo con su receta a la farmacia.  - Tambin puede pasar por nuestra oficina durante el horario de atencin regular y recoger una tarjeta de cupones de GoodRx.  - Si necesita que su receta se enve electrnicamente a una farmacia diferente, informe a nuestra oficina a travs de MyChart de [place]   o por telfono llamando al [phone] y presione la opcin 4.  

## 2022-02-02 NOTE — Assessment & Plan Note (Signed)
Urinalysis negative for leukocytes. Encourage patient to increase hydration.

## 2022-02-04 ENCOUNTER — Encounter: Payer: Self-pay | Admitting: Dermatology

## 2022-02-16 ENCOUNTER — Ambulatory Visit (INDEPENDENT_AMBULATORY_CARE_PROVIDER_SITE_OTHER): Payer: BC Managed Care – PPO

## 2022-02-16 DIAGNOSIS — L209 Atopic dermatitis, unspecified: Secondary | ICD-10-CM

## 2022-02-16 MED ORDER — TRALOKINUMAB-LDRM 150 MG/ML ~~LOC~~ SOSY
300.0000 mg | PREFILLED_SYRINGE | Freq: Once | SUBCUTANEOUS | Status: AC
  Administered 2022-02-16: 300 mg via SUBCUTANEOUS

## 2022-02-16 NOTE — Progress Notes (Signed)
Patient here for two week Adbry injections for severe atopic dermatitis.   Adbry '300mg'$  injected into left upper arm. Patient tolerated well. Patient brought injections today.  LOT: [id number] EXP: 02/2023 NDC: [place], RMA

## 2022-02-17 ENCOUNTER — Encounter: Payer: Self-pay | Admitting: Dermatology

## 2022-03-02 ENCOUNTER — Ambulatory Visit (INDEPENDENT_AMBULATORY_CARE_PROVIDER_SITE_OTHER): Payer: BC Managed Care – PPO

## 2022-03-02 DIAGNOSIS — L209 Atopic dermatitis, unspecified: Secondary | ICD-10-CM

## 2022-03-02 MED ORDER — TRALOKINUMAB-LDRM 150 MG/ML ~~LOC~~ SOSY
300.0000 mg | PREFILLED_SYRINGE | Freq: Once | SUBCUTANEOUS | Status: AC
  Administered 2022-03-02: 300 mg via SUBCUTANEOUS

## 2022-03-02 NOTE — Progress Notes (Signed)
Patient here for two week Adbry injections for severe atopic dermatitis.    Adbry 300mg injected into right upper arm. Patient tolerated well.   LOT: [id number] EXP: 02/2023 NDC: [id number]   Jill Holmes, RMA       

## 2022-03-08 ENCOUNTER — Other Ambulatory Visit: Payer: Self-pay | Admitting: Internal Medicine

## 2022-03-11 ENCOUNTER — Other Ambulatory Visit: Payer: Self-pay | Admitting: Internal Medicine

## 2022-03-16 ENCOUNTER — Ambulatory Visit: Payer: BC Managed Care – PPO | Admitting: Dermatology

## 2022-03-16 ENCOUNTER — Encounter: Payer: Self-pay | Admitting: Dermatology

## 2022-03-16 VITALS — BP 115/67 | HR 81

## 2022-03-16 DIAGNOSIS — L209 Atopic dermatitis, unspecified: Secondary | ICD-10-CM | POA: Diagnosis not present

## 2022-03-16 DIAGNOSIS — L299 Pruritus, unspecified: Secondary | ICD-10-CM | POA: Diagnosis not present

## 2022-03-16 DIAGNOSIS — Z79899 Other long term (current) drug therapy: Secondary | ICD-10-CM

## 2022-03-16 MED ORDER — TRALOKINUMAB-LDRM 150 MG/ML ~~LOC~~ SOSY
300.0000 mg | PREFILLED_SYRINGE | Freq: Once | SUBCUTANEOUS | Status: AC
  Administered 2022-03-16: 300 mg via SUBCUTANEOUS

## 2022-03-16 NOTE — Patient Instructions (Addendum)
Due to recent changes in healthcare laws, you may see results of your pathology and/or laboratory studies on MyChart before the doctors have had a chance to review them. We understand that in some cases there may be results that are confusing or concerning to you. Please understand that not all results are received at the same time and often the doctors may need to interpret multiple results in order to provide you with the best plan of care or course of treatment. Therefore, we ask that you please give us 2 business days to thoroughly review all your results before contacting the office for clarification. Should we see a critical lab result, you will be contacted sooner.   If You Need Anything After Your Visit  If you have any questions or concerns for your doctor, please call our main line at [phone] and press option 4 to reach your doctor's medical assistant. If no one answers, please leave a voicemail as directed and we will return your call as soon as possible. Messages left after 4 pm will be answered the following business day.   You may also send us a message via MyChart. We typically respond to MyChart messages within 1-2 business days.  For prescription refills, please ask your pharmacy to contact our office. Our fax number is [phone].  If you have an urgent issue when the clinic is closed that cannot wait until the next business day, you can page your doctor at the number below.    Please note that while we do our best to be available for urgent issues outside of office hours, we are not available 24/7.   If you have an urgent issue and are unable to reach us, you may choose to seek medical care at your doctor's office, retail clinic, urgent care center, or emergency room.  If you have a medical emergency, please immediately call 911 or go to the emergency department.  Pager Numbers  - Dr. Kowalski: [phone]  - Dr. Moye: [phone]  - Dr. Stewart:  [phone]  In the event of inclement weather, please call our main line at [phone] for an update on the status of any delays or closures.  Dermatology Medication Tips: Please keep the boxes that topical medications come in in order to help keep track of the instructions about where and how to use these. Pharmacies typically print the medication instructions only on the boxes and not directly on the medication tubes.   If your medication is too expensive, please contact our office at [phone] option 4 or send us a message through MyChart.   We are unable to tell what your co-pay for medications will be in advance as this is different depending on your insurance coverage. However, we may be able to find a substitute medication at lower cost or fill out paperwork to get insurance to cover a needed medication.   If a prior authorization is required to get your medication covered by your insurance company, please allow us 1-2 business days to complete this process.  Drug prices often vary depending on where the prescription is filled and some pharmacies may offer cheaper prices.  The website www.goodrx.com contains coupons for medications through different pharmacies. The prices here do not account for what the cost may be with help from insurance (it may be cheaper with your insurance), but the website can give you the price if you did not use any insurance.  - You can print the associated coupon and take it with   your prescription to the pharmacy.  - You may also stop by our office during regular business hours and pick up a GoodRx coupon card.  - If you need your prescription sent electronically to a different pharmacy, notify our office through [facility] MyChart or by phone at [phone] option 4.     Si Usted Necesita Algo Despus de Su Visita  Tambin puede enviarnos un mensaje a travs de MyChart. Por lo general respondemos a los mensajes de MyChart en el transcurso de 1 a 2  das hbiles.  Para renovar recetas, por favor pida a su farmacia que se ponga en contacto con nuestra oficina. Nuestro nmero de fax es el [phone].  Si tiene un asunto urgente cuando la clnica est cerrada y que no puede esperar hasta el siguiente da hbil, puede llamar/localizar a su doctor(a) al nmero que aparece a continuacin.   Por favor, tenga en cuenta que aunque hacemos todo lo posible para estar disponibles para asuntos urgentes fuera del horario de oficina, no estamos disponibles las 24 horas del da, los 7 das de la semana.   Si tiene un problema urgente y no puede comunicarse con nosotros, puede optar por buscar atencin mdica  en el consultorio de su doctor(a), en una clnica privada, en un centro de atencin urgente o en una sala de emergencias.  Si tiene una emergencia mdica, por favor llame inmediatamente al 911 o vaya a la sala de emergencias.  Nmeros de bper  - Dr. Kowalski: [phone]  - Dra. Moye: [phone]  - Dra. Stewart: [phone]  En caso de inclemencias del tiempo, por favor llame a nuestra lnea principal al [phone] para una actualizacin sobre el estado de cualquier retraso o cierre.  Consejos para la medicacin en dermatologa: Por favor, guarde las cajas en las que vienen los medicamentos de uso tpico para ayudarle a seguir las instrucciones sobre dnde y cmo usarlos. Las farmacias generalmente imprimen las instrucciones del medicamento slo en las cajas y no directamente en los tubos del medicamento.   Si su medicamento es muy caro, por favor, pngase en contacto con nuestra oficina llamando al [phone] y presione la opcin 4 o envenos un mensaje a travs de MyChart.   No podemos decirle cul ser su copago por los medicamentos por adelantado ya que esto es diferente dependiendo de la cobertura de su seguro. Sin embargo, es posible que podamos encontrar un medicamento sustituto a menor costo o llenar un formulario para que el  seguro cubra el medicamento que se considera necesario.   Si se requiere una autorizacin previa para que su compaa de seguros cubra su medicamento, por favor permtanos de 1 a 2 das hbiles para completar este proceso.  Los precios de los medicamentos varan con frecuencia dependiendo del lugar de dnde se surte la receta y alguna farmacias pueden ofrecer precios ms baratos.  El sitio web www.goodrx.com tiene cupones para medicamentos de diferentes farmacias. Los precios aqu no tienen en cuenta lo que podra costar con la ayuda del seguro (puede ser ms barato con su seguro), pero el sitio web puede darle el precio si no utiliz ningn seguro.  - Puede imprimir el cupn correspondiente y llevarlo con su receta a la farmacia.  - Tambin puede pasar por nuestra oficina durante el horario de atencin regular y recoger una tarjeta de cupones de GoodRx.  - Si necesita que su receta se enve electrnicamente a una farmacia diferente, informe a nuestra oficina a travs de MyChart de [place] [facility]   o por telfono llamando al [phone] y presione la opcin 4.  

## 2022-03-16 NOTE — Progress Notes (Signed)
   Follow-Up Visit   Subjective  Jill Holmes is a 67 y.o. female who presents for the following: Eczema (Patient here today for follow up on atopic dermatitis at arms and legs. Patient reports she has improved while getting Adbry injections. ). The patient has spots, moles and lesions to be evaluated, some may be new or changing and the patient has concerns that these could be cancer.  The following portions of the chart were reviewed this encounter and updated as appropriate:  Tobacco  Allergies  Meds  Problems  Med Hx  Surg Hx  Fam Hx     Review of Systems: No other skin or systemic complaints except as noted in HPI or Assessment and Plan.  Objective  Well appearing patient in no apparent distress; mood and affect are within normal limits.  A focused examination was performed including arms and leg. Relevant physical exam findings are noted in the Assessment and Plan.  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 started Adbry on 01/19/2022    Atopic dermatitis (eczema) is a chronic, relapsing, pruritic condition that can significantly affect quality of life. It is often associated with allergic rhinitis and/or asthma and can require treatment with topical medications, phototherapy, or in severe cases a biologic medication called Dupixent, which requires long term medication management.     Chronic and persistent condition with duration or expected duration over one year. Condition is symptomatic / bothersome to patient. Not to goal.  Dupixent started 09/26/2021.   Patient injected today in left upper arm and tolerated well with no adverse reactions. [facility] [phone] Lot [id number] Exp 03/23/23   Cont Adbry '150mg'$ /ml x 2 sq injections q 2 wks,  Cont TMC 0.1% cr qd up to 5d/wk aa body prn flares, avoid f/g/a Cont Allegra bid   Cont Benadryl qhs prn  Continue N-acetylcysteine (NAC) 600 mg supplement three times per day to help with picking Cont Mupirocin ointment apply to affected scabs or sores on body qd-bid    Topical steroids (such as triamcinolone, fluocinolone, fluocinonide, mometasone, clobetasol, halobetasol, betamethasone, hydrocortisone) can cause thinning and lightening of the skin if they are used for too long in the same area. Your physician has selected the right strength medicine for your problem and area affected on the body. Please use your medication only as directed by your physician to prevent side effects.  Tralokinumab-ldrm SOSY 300 mg - legs and arms  Return for every 2 weeks atopic derm and 2 - 3 month atopic derm.  IRuthell Rummage, CMA, am acting as scribe for Sarina Ser, MD. Documentation: I have reviewed the above documentation for accuracy and completeness, and I agree with the above.  Sarina Ser, MD

## 2022-03-17 ENCOUNTER — Other Ambulatory Visit: Payer: Self-pay | Admitting: Internal Medicine

## 2022-03-24 ENCOUNTER — Encounter: Payer: Self-pay | Admitting: Dermatology

## 2022-03-24 ENCOUNTER — Other Ambulatory Visit: Payer: Self-pay | Admitting: Nurse Practitioner

## 2022-03-30 ENCOUNTER — Ambulatory Visit (INDEPENDENT_AMBULATORY_CARE_PROVIDER_SITE_OTHER): Payer: BC Managed Care – PPO

## 2022-03-30 DIAGNOSIS — L209 Atopic dermatitis, unspecified: Secondary | ICD-10-CM

## 2022-03-30 MED ORDER — TRALOKINUMAB-LDRM 150 MG/ML ~~LOC~~ SOSY
300.0000 mg | PREFILLED_SYRINGE | Freq: Once | SUBCUTANEOUS | Status: AC
  Administered 2022-03-30: 300 mg via SUBCUTANEOUS

## 2022-03-30 NOTE — Progress Notes (Signed)
Patient here for two week Adbry injections for severe atopic dermatitis.    Adbry 300mg injected into right upper arm. Patient tolerated well.   LOT: [id number] EXP: 02/2023 NDC: [id number]   Jill Holmes, RMA       

## 2022-04-05 ENCOUNTER — Telehealth: Payer: Self-pay

## 2022-04-05 NOTE — Telephone Encounter (Signed)
ADBRY medication delivered today and placed in refrigerator.

## 2022-04-07 ENCOUNTER — Other Ambulatory Visit: Payer: Self-pay | Admitting: Nurse Practitioner

## 2022-04-13 ENCOUNTER — Ambulatory Visit (INDEPENDENT_AMBULATORY_CARE_PROVIDER_SITE_OTHER): Payer: BC Managed Care – PPO

## 2022-04-13 DIAGNOSIS — L209 Atopic dermatitis, unspecified: Secondary | ICD-10-CM

## 2022-04-13 MED ORDER — TRALOKINUMAB-LDRM 150 MG/ML ~~LOC~~ SOSY
300.0000 mg | PREFILLED_SYRINGE | Freq: Once | SUBCUTANEOUS | Status: AC
  Administered 2022-04-13: 300 mg via SUBCUTANEOUS

## 2022-04-13 NOTE — Progress Notes (Signed)
Patient here for two week Adbry injections for severe atopic dermatitis.    Adbry 368m injected into left upper arm. Patient tolerated well.   LOT: [id number]: 02/2023 NDC: [name]

## 2022-04-27 ENCOUNTER — Ambulatory Visit (INDEPENDENT_AMBULATORY_CARE_PROVIDER_SITE_OTHER): Payer: BC Managed Care – PPO

## 2022-04-27 DIAGNOSIS — L209 Atopic dermatitis, unspecified: Secondary | ICD-10-CM | POA: Diagnosis not present

## 2022-04-27 MED ORDER — TRALOKINUMAB-LDRM 150 MG/ML ~~LOC~~ SOSY
300.0000 mg | PREFILLED_SYRINGE | Freq: Once | SUBCUTANEOUS | Status: AC
  Administered 2022-04-27: 300 mg via SUBCUTANEOUS

## 2022-04-27 NOTE — Progress Notes (Signed)
Patient here for two week Adbry injections for severe atopic dermatitis.    Adbry '300mg'$  injected into right upper arm. Patient tolerated well.   LOT: [id number] EXP: 02/2023 NDC: [place], RMA

## 2022-05-09 ENCOUNTER — Other Ambulatory Visit: Payer: Self-pay | Admitting: Dermatology

## 2022-05-11 ENCOUNTER — Ambulatory Visit (INDEPENDENT_AMBULATORY_CARE_PROVIDER_SITE_OTHER): Payer: BC Managed Care – PPO

## 2022-05-11 DIAGNOSIS — L209 Atopic dermatitis, unspecified: Secondary | ICD-10-CM | POA: Diagnosis not present

## 2022-05-11 MED ORDER — TRALOKINUMAB-LDRM 150 MG/ML ~~LOC~~ SOSY
300.0000 mg | PREFILLED_SYRINGE | Freq: Once | SUBCUTANEOUS | Status: AC
  Administered 2022-05-11: 300 mg via SUBCUTANEOUS

## 2022-05-11 NOTE — Progress Notes (Signed)
Patient here for two week Adbry injections for severe atopic dermatitis.    Adbry 300mg  injected into left upper arm. Patient tolerated well.   LOT: [id number] EXP: 02/2023 NDC: [place], RMA

## 2022-05-16 ENCOUNTER — Other Ambulatory Visit: Payer: Self-pay | Admitting: Dermatology

## 2022-05-25 ENCOUNTER — Ambulatory Visit (INDEPENDENT_AMBULATORY_CARE_PROVIDER_SITE_OTHER): Payer: BC Managed Care – PPO

## 2022-05-25 DIAGNOSIS — L209 Atopic dermatitis, unspecified: Secondary | ICD-10-CM | POA: Diagnosis not present

## 2022-05-25 MED ORDER — TRALOKINUMAB-LDRM 150 MG/ML ~~LOC~~ SOSY
300.0000 mg | PREFILLED_SYRINGE | Freq: Once | SUBCUTANEOUS | Status: AC
  Administered 2022-05-25: 300 mg via SUBCUTANEOUS

## 2022-05-25 NOTE — Progress Notes (Signed)
Patient here for two week Adbry injections for severe atopic dermatitis.    Adbry 300mg  injected into right upper arm. Patient tolerated well.   LOT: [id number] EXP: 02/2023 NDC: [place], RMA

## 2022-06-15 ENCOUNTER — Ambulatory Visit: Payer: BC Managed Care – PPO | Admitting: Dermatology

## 2022-06-15 ENCOUNTER — Encounter: Payer: Self-pay | Admitting: Dermatology

## 2022-06-15 DIAGNOSIS — Z79899 Other long term (current) drug therapy: Secondary | ICD-10-CM | POA: Diagnosis not present

## 2022-06-15 DIAGNOSIS — L209 Atopic dermatitis, unspecified: Secondary | ICD-10-CM | POA: Diagnosis not present

## 2022-06-15 DIAGNOSIS — T07XXXA Unspecified multiple injuries, initial encounter: Secondary | ICD-10-CM

## 2022-06-15 MED ORDER — TRALOKINUMAB-LDRM 150 MG/ML ~~LOC~~ SOSY
300.0000 mg | PREFILLED_SYRINGE | Freq: Once | SUBCUTANEOUS | Status: AC
  Administered 2022-06-15: 300 mg via SUBCUTANEOUS

## 2022-06-15 MED ORDER — TACROLIMUS 0.1 % EX OINT: TOPICAL_OINTMENT | Freq: Two times a day (BID) | CUTANEOUS | 2 refills | Status: DC

## 2022-06-15 NOTE — Progress Notes (Signed)
   Follow-Up Visit   Subjective  Jill Holmes is a 67 y.o. female who presents for the following: follow up for atopic dermatitis. Patient currently on Adbry and using TMC 0.1% cream, needs refills. She is also taking Allegra three times daily, NAC twice daily and occasionally Benadryl at bedtime. Patient advises she starts getting itchy as it gets closer to time for her Adbry shots.   The following portions of the chart were reviewed this encounter and updated as appropriate: medications, allergies, medical history  Review of Systems:  No other skin or systemic complaints except as noted in HPI or Assessment and Plan.  Objective  Well appearing patient in no apparent distress; mood and affect are within normal limits. A focused examination was performed of the following areas: Legs, arms Relevant exam findings are noted in the Assessment and Plan.   Assessment & Plan   ATOPIC DERMATITIS Exam: Scaly pink papules coalescing to plaques Chronic and persistent condition with duration or expected duration over one year. Condition is symptomatic/ bothersome to patient. Not currently at goal. But much improved with decreased crusts of legs -- see photos Atopic dermatitis (eczema) is a chronic, relapsing, pruritic condition that can significantly affect quality of life. It is often associated with allergic rhinitis and/or asthma and can require treatment with topical medications, phototherapy, or in severe cases biologic injectable medication (Dupixent; Adbry) or Oral JAK inhibitors.  Treatment Plan: Start protopic twice daily Continue Adbry 300 mg SQ Q2 weeks Adbry 300 mg injected today to left upper arm. Patient tolerated well. Lot # [id number] Exp: 10/2022 Cont Allegra bid  Cont Benadryl qhs prn  Continue N-acetylcysteine (NAC) 600 mg supplement three times per day to help with picking Cont Mupirocin ointment apply to affected scabs or sores on body qd-bid   Recommend gentle skin  care.  Tralokinumab [place] Getting) is a treatment given by injection for adults with moderate-to-severe atopic dermatitis. Goal is control of skin condition, not cure. It is given as 4 injections at the first dose followed by 2 injections ever 2 weeks thereafter.  Potential side effects include allergic reaction, injection site reactions and conjunctivitis (inflammation of the eyes).  The use of Adbry requires long term medication management, including periodic office visits.  Atopic dermatitis, unspecified type  Related Medications Tralokinumab-ldrm SOSY 300 mg          Return in about 5 months (around 11/15/2022) for Dermatitis.  Anise Salvo, RMA, am acting as scribe for Armida Sans, MD .  Documentation: I have reviewed the above documentation for accuracy and completeness, and I agree with the above.  Armida Sans, MD

## 2022-06-15 NOTE — Patient Instructions (Signed)
Tralokinumab (Adbry) is a treatment given by injection for adults with moderate-to-severe atopic dermatitis. Goal is control of skin condition, not cure. It is given as 4 injections at the first dose followed by 2 injections ever 2 weeks thereafter.  Potential side effects include allergic reaction, injection site reactions and conjunctivitis (inflammation of the eyes).  The use of Adbry requires long term medication management, including periodic office visits.      Due to recent changes in healthcare laws, you may see results of your pathology and/or laboratory studies on MyChart before the doctors have had a chance to review them. We understand that in some cases there may be results that are confusing or concerning to you. Please understand that not all results are received at the same time and often the doctors may need to interpret multiple results in order to provide you with the best plan of care or course of treatment. Therefore, we ask that you please give us 2 business days to thoroughly review all your results before contacting the office for clarification. Should we see a critical lab result, you will be contacted sooner.   If You Need Anything After Your Visit  If you have any questions or concerns for your doctor, please call our main line at [phone] and press option 4 to reach your doctor's medical assistant. If no one answers, please leave a voicemail as directed and we will return your call as soon as possible. Messages left after 4 pm will be answered the following business day.   You may also send us a message via MyChart. We typically respond to MyChart messages within 1-2 business days.  For prescription refills, please ask your pharmacy to contact our office. Our fax number is [phone].  If you have an urgent issue when the clinic is closed that cannot wait until the next business day, you can page your doctor at the number below.    Please note that while we do  our best to be available for urgent issues outside of office hours, we are not available 24/7.   If you have an urgent issue and are unable to reach us, you may choose to seek medical care at your doctor's office, retail clinic, urgent care center, or emergency room.  If you have a medical emergency, please immediately call 911 or go to the emergency department.  Pager Numbers  - Dr. Kowalski: [phone]  - Dr. Moye: [phone]  - Dr. Stewart: [phone]  In the event of inclement weather, please call our main line at [phone] for an update on the status of any delays or closures.  Dermatology Medication Tips: Please keep the boxes that topical medications come in in order to help keep track of the instructions about where and how to use these. Pharmacies typically print the medication instructions only on the boxes and not directly on the medication tubes.   If your medication is too expensive, please contact our office at [phone] option 4 or send us a message through MyChart.   We are unable to tell what your co-pay for medications will be in advance as this is different depending on your insurance coverage. However, we may be able to find a substitute medication at lower cost or fill out paperwork to get insurance to cover a needed medication.   If a prior authorization is required to get your medication covered by your insurance company, please allow us 1-2 business days to complete this process.  Drug prices often vary   depending on where the prescription is filled and some pharmacies may offer cheaper prices.  The website www.goodrx.com contains coupons for medications through different pharmacies. The prices here do not account for what the cost may be with help from insurance (it may be cheaper with your insurance), but the website can give you the price if you did not use any insurance.  - You can print the associated coupon and take it with your prescription to the  pharmacy.  - You may also stop by our office during regular business hours and pick up a GoodRx coupon card.  - If you need your prescription sent electronically to a different pharmacy, notify our office through [place] MyChart or by phone at [phone] option 4.     Si Usted Necesita Algo Despus de Su Visita  Tambin puede enviarnos un mensaje a travs de MyChart. Por lo general respondemos a los mensajes de MyChart en el transcurso de 1 a 2 das hbiles.  Para renovar recetas, por favor pida a su farmacia que se ponga en contacto con nuestra oficina. Nuestro nmero de fax es el [phone].  Si tiene un asunto urgente cuando la clnica est cerrada y que no puede esperar hasta el siguiente da hbil, puede llamar/localizar a su doctor(a) al nmero que aparece a continuacin.   Por favor, tenga en cuenta que aunque hacemos todo lo posible para estar disponibles para asuntos urgentes fuera del horario de oficina, no estamos disponibles las 24 horas del da, los 7 das de la semana.   Si tiene un problema urgente y no puede comunicarse con nosotros, puede optar por buscar atencin mdica  en el consultorio de su doctor(a), en una clnica privada, en un centro de atencin urgente o en una sala de emergencias.  Si tiene una emergencia mdica, por favor llame inmediatamente al 911 o vaya a la sala de emergencias.  Nmeros de bper  - Dr. Kowalski: [phone]  - Dra. Moye: [phone]  - Dra. Stewart: [phone]  En caso de inclemencias del tiempo, por favor llame a nuestra lnea principal al [phone] para una actualizacin sobre el estado de cualquier retraso o cierre.  Consejos para la medicacin en dermatologa: Por favor, guarde las cajas en las que vienen los medicamentos de uso tpico para ayudarle a seguir las instrucciones sobre dnde y cmo usarlos. Las farmacias generalmente imprimen las instrucciones del medicamento slo en las cajas y no directamente en los  tubos del medicamento.   Si su medicamento es muy caro, por favor, pngase en contacto con nuestra oficina llamando al [phone] y presione la opcin 4 o envenos un mensaje a travs de MyChart.   No podemos decirle cul ser su copago por los medicamentos por adelantado ya que esto es diferente dependiendo de la cobertura de su seguro. Sin embargo, es posible que podamos encontrar un medicamento sustituto a menor costo o llenar un formulario para que el seguro cubra el medicamento que se considera necesario.   Si se requiere una autorizacin previa para que su compaa de seguros cubra su medicamento, por favor permtanos de 1 a 2 das hbiles para completar este proceso.  Los precios de los medicamentos varan con frecuencia dependiendo del lugar de dnde se surte la receta y alguna farmacias pueden ofrecer precios ms baratos.  El sitio web www.goodrx.com tiene cupones para medicamentos de diferentes farmacias. Los precios aqu no tienen en cuenta lo que podra costar con la ayuda del seguro (puede ser ms barato con su seguro),   pero el sitio web puede darle el precio si no utiliz ningn seguro.  - Puede imprimir el cupn correspondiente y llevarlo con su receta a la farmacia.  - Tambin puede pasar por nuestra oficina durante el horario de atencin regular y recoger una tarjeta de cupones de GoodRx.  - Si necesita que su receta se enve electrnicamente a una farmacia diferente, informe a nuestra oficina a travs de MyChart de [place] o por telfono llamando al [phone] y presione la opcin 4.  

## 2022-06-20 ENCOUNTER — Other Ambulatory Visit: Payer: Self-pay

## 2022-06-20 ENCOUNTER — Telehealth: Payer: Self-pay

## 2022-06-20 MED ORDER — PIMECROLIMUS 1 % EX CREA: TOPICAL_CREAM | Freq: Two times a day (BID) | CUTANEOUS | 2 refills | Status: DC

## 2022-06-20 NOTE — Telephone Encounter (Signed)
Patient called to let Dr Kirtland Bouchard know protopic will cost her out of pocket $40 she would like a cheaper topical cream

## 2022-06-20 NOTE — Telephone Encounter (Signed)
Erx;[facility] patient instructed to get rx for Tacrolimus filled

## 2022-06-22 ENCOUNTER — Other Ambulatory Visit: Payer: Self-pay | Admitting: Internal Medicine

## 2022-06-22 ENCOUNTER — Encounter: Payer: Self-pay | Admitting: Internal Medicine

## 2022-06-22 DIAGNOSIS — Z1231 Encounter for screening mammogram for malignant neoplasm of breast: Secondary | ICD-10-CM

## 2022-06-23 ENCOUNTER — Encounter: Payer: Self-pay | Admitting: Dermatology

## 2022-06-27 ENCOUNTER — Other Ambulatory Visit: Payer: Self-pay | Admitting: Nurse Practitioner

## 2022-06-29 ENCOUNTER — Ambulatory Visit (INDEPENDENT_AMBULATORY_CARE_PROVIDER_SITE_OTHER): Payer: BC Managed Care – PPO

## 2022-06-29 DIAGNOSIS — L209 Atopic dermatitis, unspecified: Secondary | ICD-10-CM | POA: Diagnosis not present

## 2022-06-29 MED ORDER — TRALOKINUMAB-LDRM 150 MG/ML ~~LOC~~ SOSY
300.0000 mg | PREFILLED_SYRINGE | Freq: Once | SUBCUTANEOUS | Status: AC
Start: 2022-06-29 — End: 2022-06-29
  Administered 2022-06-29: 300 mg via SUBCUTANEOUS

## 2022-06-29 NOTE — Progress Notes (Signed)
Patient here for two week Adbry injections for severe atopic dermatitis.    Adbry 300mg  injected into right upper arm. Patient tolerated well.   LOT: [id number] EXP: 10/2022 NDC: 14782-956-21   Dorathy Daft, RMA

## 2022-07-13 ENCOUNTER — Ambulatory Visit (INDEPENDENT_AMBULATORY_CARE_PROVIDER_SITE_OTHER): Payer: BC Managed Care – PPO

## 2022-07-13 DIAGNOSIS — L209 Atopic dermatitis, unspecified: Secondary | ICD-10-CM | POA: Diagnosis not present

## 2022-07-13 MED ORDER — TRALOKINUMAB-LDRM 150 MG/ML ~~LOC~~ SOSY
300.0000 mg | PREFILLED_SYRINGE | Freq: Once | SUBCUTANEOUS | Status: AC
Start: 2022-07-13 — End: 2022-07-13
  Administered 2022-07-13: 300 mg via SUBCUTANEOUS

## 2022-07-13 NOTE — Progress Notes (Signed)
Patient here for two week Adbry injections for severe atopic dermatitis.    Adbry 300mg  injected into left  upper arm. Patient tolerated well.   LOT: [id number] EXP: 06/2023 NDC: 45409-811-91   Dorathy Daft, RMA

## 2022-07-14 ENCOUNTER — Ambulatory Visit
Admission: RE | Admit: 2022-07-14 | Discharge: 2022-07-14 | Disposition: A | Discharge status: Home / Self Care | Payer: BC Managed Care – PPO | Source: Ambulatory Visit | Attending: Internal Medicine | Admitting: Internal Medicine

## 2022-07-14 DIAGNOSIS — Z1231 Encounter for screening mammogram for malignant neoplasm of breast: Secondary | ICD-10-CM | POA: Diagnosis present

## 2022-07-27 ENCOUNTER — Ambulatory Visit (INDEPENDENT_AMBULATORY_CARE_PROVIDER_SITE_OTHER): Payer: BC Managed Care – PPO

## 2022-07-27 DIAGNOSIS — L209 Atopic dermatitis, unspecified: Secondary | ICD-10-CM | POA: Diagnosis not present

## 2022-07-27 MED ORDER — DUPILUMAB 300 MG/2ML ~~LOC~~ SOSY
300.0000 mg | PREFILLED_SYRINGE | Freq: Once | SUBCUTANEOUS | Status: AC
Start: 2022-07-27 — End: 2022-07-27
  Administered 2022-07-27: 300 mg via SUBCUTANEOUS

## 2022-07-27 NOTE — Progress Notes (Signed)
Patient here for two week Adbry injections for severe atopic dermatitis.      Patient was injected with Dupixent today. Discussed error with Dr. Willeen Niece and [facility] but advised not to give Adbry on top of Dupixent injection. Patient advised of error.   Dupixent LOT: [id number] EXP: 09/2024  Dorathy Daft, RMA

## 2022-08-08 ENCOUNTER — Other Ambulatory Visit: Payer: Self-pay | Admitting: Podiatry

## 2022-08-10 ENCOUNTER — Ambulatory Visit: Payer: BC Managed Care – PPO

## 2022-08-10 DIAGNOSIS — L209 Atopic dermatitis, unspecified: Secondary | ICD-10-CM

## 2022-08-10 DIAGNOSIS — Z79899 Other long term (current) drug therapy: Secondary | ICD-10-CM

## 2022-08-10 MED ORDER — TRALOKINUMAB-LDRM 150 MG/ML ~~LOC~~ SOSY
300.0000 mg | PREFILLED_SYRINGE | Freq: Once | SUBCUTANEOUS | Status: AC
Start: 2022-08-10 — End: 2022-08-10
  Administered 2022-08-10: 300 mg via SUBCUTANEOUS

## 2022-08-10 NOTE — Progress Notes (Signed)
Patient here for two week Adbry injections for severe atopic dermatitis.    Adbry 300mg injected into left  upper arm. Patient tolerated well.   LOT: [id number] EXP: 06/2023 NDC: 50222-346-22   Jusitn Salsgiver, RMA 

## 2022-08-23 ENCOUNTER — Ambulatory Visit (INDEPENDENT_AMBULATORY_CARE_PROVIDER_SITE_OTHER): Payer: BC Managed Care – PPO

## 2022-08-23 DIAGNOSIS — L209 Atopic dermatitis, unspecified: Secondary | ICD-10-CM

## 2022-08-23 MED ORDER — TRALOKINUMAB-LDRM 150 MG/ML ~~LOC~~ SOSY
300.0000 mg | PREFILLED_SYRINGE | Freq: Once | SUBCUTANEOUS | Status: AC
Start: 2022-08-23 — End: 2022-08-23
  Administered 2022-08-23: 300 mg via SUBCUTANEOUS

## 2022-08-23 NOTE — Progress Notes (Signed)
Patient here for two week Adbry injections for severe atopic dermatitis.    Adbry 300mg  injected into right  upper arm. Patient tolerated well.   LOT: [id number] EXP: 06/2023 NDC: 45409-811-91   Dorathy Daft, RMA

## 2022-09-01 ENCOUNTER — Encounter: Payer: Self-pay | Admitting: Podiatry

## 2022-09-01 ENCOUNTER — Ambulatory Visit: Payer: BC Managed Care – PPO | Admitting: Podiatry

## 2022-09-01 VITALS — BP 142/75 | HR 70

## 2022-09-01 DIAGNOSIS — M2141 Flat foot [pes planus] (acquired), right foot: Secondary | ICD-10-CM | POA: Diagnosis not present

## 2022-09-01 DIAGNOSIS — M7751 Other enthesopathy of right foot: Secondary | ICD-10-CM

## 2022-09-01 DIAGNOSIS — M19071 Primary osteoarthritis, right ankle and foot: Secondary | ICD-10-CM

## 2022-09-01 DIAGNOSIS — M2142 Flat foot [pes planus] (acquired), left foot: Secondary | ICD-10-CM | POA: Diagnosis not present

## 2022-09-01 DIAGNOSIS — M778 Other enthesopathies, not elsewhere classified: Secondary | ICD-10-CM

## 2022-09-01 MED ORDER — BETAMETHASONE SOD PHOS & ACET 6 (3-3) MG/ML IJ SUSP
3.0000 mg | Freq: Once | INTRAMUSCULAR | Status: AC
Start: 2022-09-01 — End: 2022-09-01
  Administered 2022-09-01: 3 mg via INTRA_ARTICULAR

## 2022-09-01 MED ORDER — MELOXICAM 15 MG PO TABS: 15.0000 mg | ORAL_TABLET | Freq: Every day | ORAL | 2 refills | Status: DC

## 2022-09-01 NOTE — Progress Notes (Signed)
   Chief Complaint  Patient presents with   Foot Pain    "I need a refill for my Meloxicam.  I need two shots in my foot."    HPI: Patient presenting today for follow-up evaluation of right foot pain that is chronic.  Patient has an old pair of orthotics but she says that the orthotics are worn out.  Last visit she was molded for orthotics but unfortunately it was never processed due to changing of staff here in our office.  She would like to get remolded today  Past Medical History:  Diagnosis Date   Cancer (HCC)    thyroid ca   Diabetes mellitus without complication (HCC)    Diverticulitis    Hypertension    Thyroid disease    Past Surgical History:  Procedure Laterality Date   ABDOMINAL HYSTERECTOMY     COLONOSCOPY WITH PROPOFOL N/A 02/07/2019   Procedure: COLONOSCOPY WITH PROPOFOL;  Surgeon: Midge Minium, MD;  Location: [facility] ENDOSCOPY;  Service: Endoscopy;  Laterality: N/A;   Allergies  Allergen Reactions   Elemental Sulfur Anaphylaxis   Enalapril Anaphylaxis   Lisinopril    Sulfa Antibiotics       Physical Exam: General: The patient is alert and oriented x3 in no acute distress.  Dermatology: Skin is warm, dry and supple bilateral lower extremities. Negative for open lesions or macerations.  Vascular: Neurovascular status intact. Palpable pedal pulses bilaterally. No edema or erythema noted. Capillary refill within normal limits.  Neurological: Grossly intact via light touch.   Musculoskeletal Exam: There is some tenderness on palpation and range of motion to the second MTPJ right foot as well as the midfoot of the right lower extremity.  Range of motion WNL.  Muscle strength WNL.  Collapse of the medial longitudinal arch of the foot with weightbearing.   Assessment: 1. Second MPJ capsulitis right foot 2. Midfoot capsulitis right foot 3.  Pes planus with DJD bilateral  Plan of Care:  -Patient was evaluated.  -Injection of 0.5 mL Celestone Soluspan injected  into the Lisfranc joint/midtarsal joint and second MTP right foot -Refill prescription for meloxicam 15 mg daily as needed.  -Continue taking Gabapentin as directed by PCP.  -Patient states that she was never molded for orthotics or the orthotics mold never was processed last visit.  Today the patient was scanned for custom molded orthotics.  -Orthotics order placed.  Medically necessary to support the medial longitudinal arch of the foot and avoid arch collapse.  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    Felecia Shelling, DPM [facility]  Dr. Felecia Shelling, DPM    [address] [place], [place] [address]                Office [phone]  Fax [phone]

## 2022-09-06 ENCOUNTER — Ambulatory Visit: Payer: BC Managed Care – PPO

## 2022-09-07 ENCOUNTER — Ambulatory Visit (INDEPENDENT_AMBULATORY_CARE_PROVIDER_SITE_OTHER): Payer: BC Managed Care – PPO

## 2022-09-07 DIAGNOSIS — L209 Atopic dermatitis, unspecified: Secondary | ICD-10-CM

## 2022-09-07 MED ORDER — TRALOKINUMAB-LDRM 150 MG/ML ~~LOC~~ SOSY
300.0000 mg | PREFILLED_SYRINGE | Freq: Once | SUBCUTANEOUS | Status: AC
Start: 2022-09-07 — End: 2022-09-07
  Administered 2022-09-07: 300 mg via SUBCUTANEOUS

## 2022-09-07 NOTE — Progress Notes (Signed)
Patient here for two week Adbry injections for severe atopic dermatitis.    Adbry 300mg  injected into left upper arm. Patient tolerated well.   LOT: [id number] EXP: 06/2023 NDC: 45409-811-91  Cortina Vultaggio RMA

## 2022-09-19 ENCOUNTER — Telehealth: Payer: Self-pay

## 2022-09-19 DIAGNOSIS — L209 Atopic dermatitis, unspecified: Secondary | ICD-10-CM

## 2022-09-19 NOTE — Telephone Encounter (Signed)
Orders needed for patient to continue Adbry every two weeks until follow up with you in October.  Okay to enter?

## 2022-09-20 MED ORDER — TRALOKINUMAB-LDRM 150 MG/ML ~~LOC~~ SOSY
300.0000 mg | PREFILLED_SYRINGE | SUBCUTANEOUS | Status: AC
Start: 2022-09-20 — End: 2022-12-13
  Administered 2022-09-21 – 2022-11-16 (×3): 300 mg via SUBCUTANEOUS

## 2022-09-21 ENCOUNTER — Ambulatory Visit (INDEPENDENT_AMBULATORY_CARE_PROVIDER_SITE_OTHER): Payer: BC Managed Care – PPO

## 2022-09-21 DIAGNOSIS — L209 Atopic dermatitis, unspecified: Secondary | ICD-10-CM

## 2022-09-21 NOTE — Progress Notes (Signed)
Patient here for two week Adbry injections for severe atopic dermatitis.    Adbry 300mg  injected into right upper arm. Patient tolerated well.   LOT: [id number] EXP: 09/2023 NDC: 45409-811-91   Dorathy Daft, RMA

## 2022-09-29 ENCOUNTER — Ambulatory Visit: Payer: BC Managed Care – PPO

## 2022-09-29 DIAGNOSIS — M7751 Other enthesopathy of right foot: Secondary | ICD-10-CM

## 2022-09-29 DIAGNOSIS — M2142 Flat foot [pes planus] (acquired), left foot: Secondary | ICD-10-CM

## 2022-09-29 DIAGNOSIS — M778 Other enthesopathies, not elsewhere classified: Secondary | ICD-10-CM

## 2022-09-29 NOTE — Progress Notes (Unsigned)
Patient presents today to pick up custom orthotic  Patient was dispensed 1 pair of custom orthotics . Fit was satisfactory. Instructions for break-in and wear was reviewed and a copy was given to the patient.

## 2022-10-05 ENCOUNTER — Ambulatory Visit (INDEPENDENT_AMBULATORY_CARE_PROVIDER_SITE_OTHER): Payer: BC Managed Care – PPO

## 2022-10-05 DIAGNOSIS — L209 Atopic dermatitis, unspecified: Secondary | ICD-10-CM | POA: Diagnosis not present

## 2022-10-05 MED ORDER — TRALOKINUMAB-LDRM 150 MG/ML ~~LOC~~ SOSY
300.0000 mg | PREFILLED_SYRINGE | Freq: Once | SUBCUTANEOUS | Status: AC
Start: 2022-10-05 — End: 2022-10-05
  Administered 2022-10-05: 300 mg via SUBCUTANEOUS

## 2022-10-05 NOTE — Progress Notes (Signed)
Patient here for two week Adbry injections for severe atopic dermatitis.    Adbry 300mg  injected into left upper arm. Patient tolerated well.   LOT: [id number] EXP: 09/2023 NDC: 14782-956-21   Evorn Gong, RMA

## 2022-10-19 ENCOUNTER — Ambulatory Visit (INDEPENDENT_AMBULATORY_CARE_PROVIDER_SITE_OTHER): Payer: BC Managed Care – PPO

## 2022-10-19 DIAGNOSIS — L209 Atopic dermatitis, unspecified: Secondary | ICD-10-CM

## 2022-10-19 MED ORDER — TRALOKINUMAB-LDRM 150 MG/ML ~~LOC~~ SOSY
300.0000 mg | PREFILLED_SYRINGE | Freq: Once | SUBCUTANEOUS | Status: AC
Start: 2022-10-19 — End: 2022-10-19
  Administered 2022-10-19: 300 mg via SUBCUTANEOUS

## 2022-10-19 NOTE — Progress Notes (Signed)
Patient here for two week Adbry injections for severe atopic dermatitis.    Adbry 300mg  injected into right upper arm. Patient tolerated well.   LOT: [id number] EXP: 09/2023 NDC: 45409-811-91   Evorn Gong, RMA

## 2022-11-02 ENCOUNTER — Ambulatory Visit (INDEPENDENT_AMBULATORY_CARE_PROVIDER_SITE_OTHER): Payer: BC Managed Care – PPO

## 2022-11-02 DIAGNOSIS — L209 Atopic dermatitis, unspecified: Secondary | ICD-10-CM | POA: Diagnosis not present

## 2022-11-02 NOTE — Progress Notes (Signed)
Patient here for two week Adbry injections for severe atopic dermatitis.    Adbry 300mg  injected into left upper arm. Patient tolerated well.   LOT: [id number] EXP: 09/2023 NDC: 53664-403-47   Dorathy Daft, RMA

## 2022-11-06 ENCOUNTER — Other Ambulatory Visit: Payer: Self-pay | Admitting: Dermatology

## 2022-11-16 ENCOUNTER — Ambulatory Visit: Payer: BC Managed Care – PPO

## 2022-11-16 DIAGNOSIS — L209 Atopic dermatitis, unspecified: Secondary | ICD-10-CM | POA: Diagnosis not present

## 2022-11-16 NOTE — Progress Notes (Signed)
Patient here for two week Adbry injections for severe atopic dermatitis.    Adbry 300mg  injected into right upper arm. Patient tolerated well.   LOT: [id number]  EXP: 09/2023 NDC: 44010-272-53   Dorathy Daft, RMA

## 2022-12-05 ENCOUNTER — Ambulatory Visit: Payer: BC Managed Care – PPO | Admitting: Dermatology

## 2022-12-05 DIAGNOSIS — Z7189 Other specified counseling: Secondary | ICD-10-CM

## 2022-12-05 DIAGNOSIS — L2081 Atopic neurodermatitis: Secondary | ICD-10-CM

## 2022-12-05 DIAGNOSIS — L209 Atopic dermatitis, unspecified: Secondary | ICD-10-CM

## 2022-12-05 DIAGNOSIS — L299 Pruritus, unspecified: Secondary | ICD-10-CM

## 2022-12-05 DIAGNOSIS — L281 Prurigo nodularis: Secondary | ICD-10-CM

## 2022-12-05 DIAGNOSIS — Z79899 Other long term (current) drug therapy: Secondary | ICD-10-CM | POA: Diagnosis not present

## 2022-12-05 DIAGNOSIS — T07XXXA Unspecified multiple injuries, initial encounter: Secondary | ICD-10-CM

## 2022-12-05 MED ORDER — PIMECROLIMUS 1 % EX CREA
TOPICAL_CREAM | CUTANEOUS | 2 refills | Status: DC
Start: 2022-12-05 — End: 2023-09-24

## 2022-12-05 MED ORDER — MUPIROCIN 2 % EX OINT: TOPICAL_OINTMENT | CUTANEOUS | 3 refills | Status: AC

## 2022-12-05 NOTE — Patient Instructions (Addendum)
For flares at legs   Start Zoryve cream - apply thin layer topically to flares at legs daily either in morning or at night  Continue Protopic ointment - apply topically to flares at legs daily either in morning or night  Continue Allegra twice daily Continue Benadryl nightly as needed Continue NAC 600 mg supplement three times per day to help with picking Continue Mupirocin ointment - apply to any sores or scabs daily to twice daily    Gentle Skin Care Guide  1. Bathe no more than once a day.  2. Avoid bathing in hot water  3. Use a mild soap like Dove, Vanicream, Cetaphil, CeraVe. Can use Lever 2000 or Cetaphil antibacterial soap  4. Use soap only where you need it. On most days, use it under your arms, between your legs, and on your feet. Let the water rinse other areas unless visibly dirty.  5. When you get out of the bath/shower, use a towel to gently blot your skin dry, don't rub it.  6. While your skin is still a little damp, apply a moisturizing cream such as Vanicream, CeraVe, Cetaphil, Eucerin, Sarna lotion or plain Vaseline Jelly. For hands apply Neutrogena [place] Hand Cream or Excipial Hand Cream.  7. Reapply moisturizer any time you start to itch or feel dry.  8. Sometimes using free and clear laundry detergents can be helpful. Fabric softener sheets should be avoided. Downy Free & Gentle liquid, or any liquid fabric softener that is free of dyes and perfumes, it acceptable to use  9. If your doctor has given you prescription creams you may apply moisturizers over them       Due to recent changes in healthcare laws, you may see results of your pathology and/or laboratory studies on MyChart before the doctors have had a chance to review them. We understand that in some cases there may be results that are confusing or concerning to you. Please understand that not all results are received at the same time and often the doctors may need to interpret multiple results  in order to provide you with the best plan of care or course of treatment. Therefore, we ask that you please give [place] 2 business days to thoroughly review all your results before contacting the office for clarification. Should we see a critical lab result, you will be contacted sooner.   If You Need Anything After Your Visit  If you have any questions or concerns for your doctor, please call our main line at [phone] and press option 4 to reach your doctor's medical assistant. If no one answers, please leave a voicemail as directed and we will return your call as soon as possible. Messages left after 4 pm will be answered the following business day.   You may also send [place] a message via MyChart. We typically respond to MyChart messages within 1-2 business days.  For prescription refills, please ask your pharmacy to contact our office. Our fax number is [phone].  If you have an urgent issue when the clinic is closed that cannot wait until the next business day, you can page your doctor at the number below.    Please note that while we do our best to be available for urgent issues outside of office hours, we are not available 24/7.   If you have an urgent issue and are unable to reach [place], you may choose to seek medical care at your doctor's office, retail clinic, urgent care center, or emergency room.  If you have a medical emergency, please immediately call 911 or go to the emergency department.  Pager Numbers  - Dr. Gwen Pounds: [phone]  - Dr. Roseanne Reno: [phone]  - Dr. Katrinka Blazing: [phone]   In the event of inclement weather, please call our main line at [phone] for an update on the status of any delays or closures.  Dermatology Medication Tips: Please keep the boxes that topical medications come in in order to help keep track of the instructions about where and how to use these. Pharmacies typically print the medication instructions only on the boxes and not directly on  the medication tubes.   If your medication is too expensive, please contact our office at [phone] option 4 or send [place] a message through MyChart.   We are unable to tell what your co-pay for medications will be in advance as this is different depending on your insurance coverage. However, we may be able to find a substitute medication at lower cost or fill out paperwork to get insurance to cover a needed medication.   If a prior authorization is required to get your medication covered by your insurance company, please allow [place] 1-2 business days to complete this process.  Drug prices often vary depending on where the prescription is filled and some pharmacies may offer cheaper prices.  The website www.goodrx.com contains coupons for medications through different pharmacies. The prices here do not account for what the cost may be with help from insurance (it may be cheaper with your insurance), but the website can give you the price if you did not use any insurance.  - You can print the associated coupon and take it with your prescription to the pharmacy.  - You may also stop by our office during regular business hours and pick up a GoodRx coupon card.  - If you need your prescription sent electronically to a different pharmacy, notify our office through [facility] or by phone at [phone] option 4.     Si Usted Necesita Algo Despus de Su Visita  Tambin puede enviarnos un mensaje a travs de Clinical cytogeneticist. Por lo general respondemos a los mensajes de MyChart en el transcurso de 1 a 2 das hbiles.  Para renovar recetas, por favor pida a su farmacia que se ponga en contacto con nuestra oficina. Annie Sable de fax es [place] [phone].  Si tiene un asunto urgente cuando la clnica est cerrada y que no puede esperar hasta el siguiente da hbil, puede llamar/localizar a su doctor(a) al nmero que aparece a continuacin.   Por favor, tenga en cuenta que aunque hacemos todo lo  posible para estar disponibles para asuntos urgentes fuera del horario de [place], no estamos disponibles las 24 horas del da, los 7 [address]  [address] de la [place].   Si tiene un problema urgente y no puede comunicarse con nosotros, puede optar por buscar atencin mdica  en el consultorio de su doctor(a), en una clnica privada, en un centro de atencin urgente o en una sala de emergencias.  Si tiene Engineer, drilling, por favor llame inmediatamente al 911 o vaya a la sala de emergencias.  Nmeros de bper  - Dr. Gwen Pounds: [phone]  - Dra. Roseanne Reno: [phone]  - Dr. Katrinka Blazing: [phone]   En caso de inclemencias del tiempo, por favor llame a Lacy Duverney principal al [phone] para una actualizacin sobre el [place] de cualquier retraso o cierre.  Consejos para la medicacin en dermatologa: Por favor, guarde las cajas en las que vienen los  medicamentos de uso tpico para ayudarle a seguir las [employer] dnde y cmo usarlos. Las farmacias generalmente imprimen las instrucciones del medicamento slo en las cajas y no directamente en los tubos del [place].   Si su medicamento es muy caro, por favor, pngase en contacto con Rolm Gala llamando al [phone] y presione la opcin 4 o envenos un mensaje a travs de Clinical cytogeneticist.   No podemos decirle cul ser su copago por los medicamentos por adelantado ya que esto es diferente dependiendo de la cobertura de su seguro. Sin embargo, es posible que podamos encontrar un medicamento sustituto a Audiological scientist un formulario para que el seguro cubra el medicamento que se considera necesario.   Si se requiere una autorizacin previa para que su compaa de seguros [place] su medicamento, por favor permtanos de 1 a 2 das hbiles para completar [address]  Los precios de los medicamentos varan con frecuencia dependiendo del Environmental consultant de dnde se surte la receta y alguna farmacias pueden ofrecer precios ms baratos.  El sitio web  www.goodrx.com tiene cupones para medicamentos de Health and safety inspector. Los precios aqu no tienen en cuenta lo que podra costar con la ayuda del seguro (puede ser ms barato con su seguro), pero el sitio web puede darle el precio si no utiliz Tourist information centre manager.  - Puede imprimir el cupn correspondiente y llevarlo con su receta a la farmacia.  - Tambin puede pasar por nuestra oficina durante el horario de atencin regular y Education officer, museum una tarjeta de cupones de GoodRx.  - Si necesita que su receta se enve electrnicamente a una farmacia diferente, informe a nuestra oficina a travs de MyChart de [place] o por telfono llamando al [phone] y presione la opcin 4.

## 2022-12-05 NOTE — Progress Notes (Signed)
   Follow-Up Visit   Subjective  Jill Holmes is a 67 y.o. female who presents for the following: Atopic Dermatitis Here for follow up. Currently on Adbry injections every 2 weeks. Reports injection site at right arm has been itchy and feel bumps after last injection 2 weeks ago. Also reports a itchy flare at b/l legs developed 2 weeks ago. Also feels eyes are more itchy and dry.   The following portions of the chart were reviewed this encounter and updated as appropriate: medications, allergies, medical history  Review of Systems:  No other skin or systemic complaints except as noted in HPI or Assessment and Plan.  Objective  Well appearing patient in no apparent distress; mood and affect are within normal limits.  Areas Examined: B/l legs, b/l arms  Relevant physical exam findings are noted in the Assessment and Plan.           Assessment & Plan   Atopic neurodermatitis  Related Medications mupirocin ointment (BACTROBAN) 2 % APPLY TO ANY SCABS OR SORES 1-2 TIMES DAILY AS DIRECTED.  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 Condition is bothersome/symptomatic for patient. Currently flared. Pt was doing well, but flared with itching after shaving and scratched her legs raw.  Atopic dermatitis - Severe, on Dupixent (biologic medication).  Atopic dermatitis (eczema) is a chronic, relapsing, pruritic condition that can significantly affect quality of life. It is often associated with allergic rhinitis and/or asthma and can require treatment with topical medications, phototherapy, or in severe cases biologic medications, which require long term medication management.    Treatment  Plan:  Stop Adbry 300 mg sq injection Will consider new injection in 2 week follow up - Nemluvio information given   Start zoryve cream - apply thin layer topically to affected areas once daily Samples given today   Continue protopic once daily either morning or night to affected areas   Cont Allegra bid  Cont Benadryl qhs prn  Continue N-acetylcysteine (NAC) 600 mg supplement three times per day to help with picking Cont Mupirocin ointment apply to affected scabs or sores on body qd-bid   Potential side effects include allergic reaction, herpes infections, injection site reactions and conjunctivitis (inflammation of the eyes).  The use of Dupixent requires long term medication management, including periodic office visits.    Long term medication management.  Patient is using long term (months to years) prescription medication  to control their dermatologic condition.  These medications require periodic monitoring to evaluate for efficacy and side effects and may require periodic laboratory monitoring.   Recommend gentle skin care.   Return for 1 - 2 week follow up for atopic dermatits .  IAsher Muir, CMA, am acting as scribe for Armida Sans, MD.   Documentation: I have reviewed the above documentation for accuracy and completeness, and I agree with the above.  Armida Sans, MD

## 2022-12-12 ENCOUNTER — Encounter: Payer: Self-pay | Admitting: Dermatology

## 2022-12-21 ENCOUNTER — Ambulatory Visit: Payer: BC Managed Care – PPO | Admitting: Dermatology

## 2022-12-21 DIAGNOSIS — L299 Pruritus, unspecified: Secondary | ICD-10-CM | POA: Diagnosis not present

## 2022-12-21 DIAGNOSIS — L2081 Atopic neurodermatitis: Secondary | ICD-10-CM

## 2022-12-21 DIAGNOSIS — T07XXXA Unspecified multiple injuries, initial encounter: Secondary | ICD-10-CM

## 2022-12-21 DIAGNOSIS — L281 Prurigo nodularis: Secondary | ICD-10-CM

## 2022-12-21 DIAGNOSIS — Z7189 Other specified counseling: Secondary | ICD-10-CM

## 2022-12-21 DIAGNOSIS — Z79899 Other long term (current) drug therapy: Secondary | ICD-10-CM

## 2022-12-21 MED ORDER — NEMLUVIO 30 MG ~~LOC~~ AUIJ: 2.0000 "pen " | AUTO-INJECTOR | SUBCUTANEOUS | 5 refills | Status: DC

## 2022-12-21 NOTE — Patient Instructions (Signed)

## 2022-12-21 NOTE — Progress Notes (Deleted)
   Follow-Up Visit   Subjective  Jill Holmes is a 67 y.o. female who presents for the following: ***  The patient has spots, moles and lesions to be evaluated, some may be new or changing and the patient may have concern these could be cancer.   The following portions of the chart were reviewed this encounter and updated as appropriate: medications, allergies, medical history  Review of Systems:  No other skin or systemic complaints except as noted in HPI or Assessment and Plan.  Objective  Well appearing patient in no apparent distress; mood and affect are within normal limits.   A focused examination was performed of the following areas:   Relevant exam findings are noted in the Assessment and Plan.    Assessment & Plan   Expand All Collapse All    Follow-Up Visit                Subjective Jill Holmes is a 67 y.o. female who presents for the following: Atopic Dermatitis Here for follow up. Currently on Adbry injections every 2 weeks. Reports injection site at right arm has been itchy and feel bumps after last injection 2 weeks ago. Also reports a itchy flare at b/l legs developed 2 weeks ago. Also feels eyes are more itchy and dry.    The following portions of the chart were reviewed this encounter and updated as appropriate: medications, allergies, medical history   Review of Systems:  No other skin or systemic complaints except as noted in HPI or Assessment and Plan.       Objective Well appearing patient in no apparent distress; mood and affect are within normal limits.   Areas Examined: B/l legs, b/l arms   Relevant physical exam findings are noted in the Assessment and Plan.                     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 Condition is bothersome/symptomatic for patient. Currently flared. Pt was doing well, but flared with itching after shaving and scratched her legs raw.   Atopic dermatitis - Severe, on Dupixent (biologic medication).  Atopic dermatitis (eczema) is a chronic, relapsing, pruritic condition that can significantly affect quality of life. It is often associated with allergic rhinitis and/or asthma and can require treatment with topical medications, phototherapy, or in severe cases biologic medications, which require long term medication management.     Treatment Plan:      No follow-ups on file.  [name], CMA, am acting as scribe for Armida Sans, MD .  Documentation: I have reviewed the above documentation for accuracy and completeness, and I agree with the above.  Armida Sans, MD

## 2022-12-21 NOTE — Progress Notes (Signed)
   Follow-Up Visit   Subjective  Jill Holmes is a 67 y.o. female who presents for the following: Atopic neurodermatitis - flaring on the legs. Pt advised to D/C Adbry due to s/e. Previously discussed Nemvulo and patient would like to know the cost and if she is able to receive vaccines while on medications. Currently she is using Zoryve cream to aa's QD.   The following portions of the chart were reviewed this encounter and updated as appropriate: medications, allergies, medical history  Review of Systems:  No other skin or systemic complaints except as noted in HPI or Assessment and Plan.  Objective  Well appearing patient in no apparent distress; mood and affect are within normal limits.  Areas Examined: The face and arms  Relevant physical exam findings are noted in the Assessment and Plan.  Assessment & Plan   ATOPIC DERMATITIS with neurodermatitis and prurigo nodularis Exam: Scaly pink papules coalescing to plaques 18% BSA  - At least 20 lesions  - 6 weeks or more of pruritus  - Chronic itching  - Triamcinolone has been tried and failed and although pt was improved on Dupixent, not to goal.  Chronic and persistent condition with duration or expected duration over one year. Condition is bothersome/symptomatic for patient. Currently flared.  Atopic dermatitis (eczema) is a chronic, relapsing, pruritic condition that can significantly affect quality of life. It is often associated with allergic rhinitis and/or asthma and can require treatment with topical medications, phototherapy, or in severe cases biologic injectable medication (Dupixent; Adbry) or Oral JAK inhibitors.  Treatment Plan: Continue Zoryve cream - apply thin layer topically to affected areas once daily   Continue Protopic once daily either morning or night to affected areas    Continue Allegra 180mg  1 tab po BID, Benadryl OTC QHS PRN, and Mupirocin ointment apply to affected scabs or sores on body qd-bid    Continue N-acetylcysteine (NAC) 600 mg supplement three times per day to help with picking.  Recommend gentle skin care.  Plan to start Nemluvio injections when samples are available.   Return for nurse visit once Nemluvio sample available, 1 mth after Nemluvio injection with Dr. Gwen Holmes.  Jill Holmes, CMA, am acting as scribe for Jill Sans, MD .   Documentation: I have reviewed the above documentation for accuracy and completeness, and I agree with the above.  Jill Sans, MD

## 2022-12-28 ENCOUNTER — Encounter: Payer: Self-pay | Admitting: Dermatology

## 2023-01-04 ENCOUNTER — Ambulatory Visit: Payer: BC Managed Care – PPO

## 2023-01-04 DIAGNOSIS — L281 Prurigo nodularis: Secondary | ICD-10-CM | POA: Diagnosis not present

## 2023-01-04 MED ORDER — NEMOLIZUMAB-ILTO 30 MG ~~LOC~~ AUIJ
60.0000 mg | AUTO-INJECTOR | Freq: Once | SUBCUTANEOUS | Status: AC
Start: 2023-01-04 — End: 2023-01-04
  Administered 2023-01-04: 60 mg via SUBCUTANEOUS

## 2023-01-04 NOTE — Progress Notes (Signed)
Patient here today to start Nemluvio injections for prurigo nodularis.   Nemluvio 60mg  injected into right and left arm. Patient tolerated well.   LOT: [id number] EXP: 07/2024  Dorathy Daft, RMA

## 2023-01-08 ENCOUNTER — Telehealth: Payer: Self-pay

## 2023-01-08 NOTE — Telephone Encounter (Signed)
Delivery of Nemluvio set for 01/09/23.

## 2023-01-08 NOTE — Telephone Encounter (Signed)
I spoke with Clydie Braun, a [employer] representative, and she stated that insurance requires patient to use [facility] so they transferred the prescription to them.

## 2023-02-01 ENCOUNTER — Ambulatory Visit: Payer: BC Managed Care – PPO | Admitting: Dermatology

## 2023-02-01 DIAGNOSIS — L729 Follicular cyst of the skin and subcutaneous tissue, unspecified: Secondary | ICD-10-CM

## 2023-02-01 DIAGNOSIS — L281 Prurigo nodularis: Secondary | ICD-10-CM

## 2023-02-01 DIAGNOSIS — L209 Atopic dermatitis, unspecified: Secondary | ICD-10-CM | POA: Diagnosis not present

## 2023-02-01 DIAGNOSIS — Z7189 Other specified counseling: Secondary | ICD-10-CM

## 2023-02-01 DIAGNOSIS — Z79899 Other long term (current) drug therapy: Secondary | ICD-10-CM | POA: Diagnosis not present

## 2023-02-01 DIAGNOSIS — L720 Epidermal cyst: Secondary | ICD-10-CM | POA: Diagnosis not present

## 2023-02-01 DIAGNOSIS — L2081 Atopic neurodermatitis: Secondary | ICD-10-CM

## 2023-02-01 MED ORDER — NEMOLIZUMAB-ILTO 30 MG ~~LOC~~ AUIJ
60.0000 mg | AUTO-INJECTOR | SUBCUTANEOUS | Status: AC
Start: 2023-03-01 — End: 2023-04-10
  Administered 2023-03-13 – 2023-04-10 (×2): 60 mg via SUBCUTANEOUS

## 2023-02-01 MED ORDER — NEMOLIZUMAB-ILTO 30 MG ~~LOC~~ AUIJ
60.0000 mg | AUTO-INJECTOR | Freq: Once | SUBCUTANEOUS | Status: AC
Start: 2023-02-01 — End: 2023-02-01
  Administered 2023-02-01: 60 mg via SUBCUTANEOUS

## 2023-02-01 NOTE — Progress Notes (Signed)
   Follow-Up Visit   Subjective  Jill Holmes is a 67 y.o. female who presents for the following: Atopic Neurodermatitis. Patient has history of flares at legs. Has been on multiple treatments in past including dupixent in past. Patient was started on Nemluvio. Patient states she has noticed improvement on sample given.   Patient was approved for medication but states she has not received information on how much medication will cost. [facility] had tried several attempts to call patient. Information scanned into patient's media. Today Patient called [facility] to discuss cost of medication. Was told medication would be more than she could afford. Still pending patient assistance program.   The following portions of the chart were reviewed this encounter and updated as appropriate: medications, allergies, medical history  Review of Systems:  No other skin or systemic complaints except as noted in HPI or Assessment and Plan.  Objective  Well appearing patient in no apparent distress; mood and affect are within normal limits.  Areas Examined: B/l legs   Relevant physical exam findings are noted in the Assessment and Plan.    Assessment & Plan   ATOPIC NEURODERMATITIS   Related Medications mupirocin ointment (BACTROBAN) 2 % APPLY TO ANY SCABS OR SORES 1-2 TIMES DAILY AS DIRECTED. pimecrolimus (ELIDEL) 1 % cream Apply topically to affected areas at legs once daily nemolizumab-ilto (NEMLUVIO) SQ injection 60 mg  nemolizumab-ilto (NEMLUVIO) SQ injection 60 mg   ATOPIC DERMATITIS Exam:  20% BSA improving on Nemluvio  Chronic and persistent condition with duration or expected duration over one year. Condition is improving with treatment but not currently at goal.   Atopic dermatitis (eczema) is a chronic, relapsing, pruritic condition that can significantly affect quality of life. It is often associated with allergic rhinitis and/or asthma and can require treatment with  topical medications, phototherapy, or in severe cases biologic injectable medication (Dupixent; Adbry) or Oral JAK inhibitors.  Treatment Plan:  Patient received sample of Nemluvio 30 mg pen x 2  injections today  Injected into patient's right and left upper arm. Patient tolerated well with no adverse reactions.  NDC 1610-9604-54 Lot [id number] Exp: 07/2024  Will continue Nemluvio 30 mg injections q 4 weeks   Continue Zoryve cream - apply thin layer topically to affected areas once daily   Continue Protopic once daily either morning or night to affected areas    Continue Allegra 180mg  1 tab po BID, Benadryl OTC QHS PRN, and Mupirocin ointment apply to affected scabs or sores on body qd-bid    Continue N-acetylcysteine (NAC) 600 mg supplement three times per day to help with picking.  Recommend gentle skin care.   EPIDERMAL INCLUSION CYST Exam: Subcutaneous nodule at left upper eyelid   Patient is bothered by cyst and would like removed. Cyst with symptoms and/or recent change.  Discussed surgical excision to remove, including resulting scar and possible recurrence.  Patient will schedule for surgery. Pre-op information given.  No follow-ups on file.  IAsher Muir, CMA, am acting as scribe for Armida Sans, MD.   Documentation: I have reviewed the above documentation for accuracy and completeness, and I agree with the above.  Armida Sans, MD

## 2023-02-01 NOTE — Patient Instructions (Signed)

## 2023-02-06 ENCOUNTER — Encounter: Payer: Self-pay | Admitting: Dermatology

## 2023-02-27 ENCOUNTER — Telehealth: Payer: Self-pay

## 2023-02-27 NOTE — Telephone Encounter (Signed)
 Left message for patient to return my call.  Dr. Hester needs to cancel 01/21 surgery. We need to refer patient to Dr. Paci for excision. (Dr. Hester has spoke to Dr. Corey and she is Okay taking his surgery patients for this day) I have cancelled patients appt.

## 2023-02-27 NOTE — Telephone Encounter (Signed)
 Submitted new Nemluvio RX through [employer] portal with new insurance. aw

## 2023-03-01 ENCOUNTER — Ambulatory Visit: Payer: BC Managed Care – PPO

## 2023-03-05 ENCOUNTER — Telehealth: Payer: Self-pay

## 2023-03-05 NOTE — Telephone Encounter (Signed)
 Patient has a cyst of her left upper eyelid. She was scheduled with Dr. Hester next week but he had to cancel. I did offer her to schedule with Dr. Paci per Dr. Hester but patient has declined as she does not travel to [facility]. Would you be willing to remove this or patient will have to wait at this time?

## 2023-03-13 ENCOUNTER — Ambulatory Visit (INDEPENDENT_AMBULATORY_CARE_PROVIDER_SITE_OTHER): Payer: 59

## 2023-03-13 ENCOUNTER — Encounter: Payer: BC Managed Care – PPO | Admitting: Dermatology

## 2023-03-13 ENCOUNTER — Telehealth: Payer: Self-pay

## 2023-03-13 ENCOUNTER — Encounter: Payer: Self-pay | Admitting: Dermatology

## 2023-03-13 ENCOUNTER — Ambulatory Visit: Payer: 59

## 2023-03-13 DIAGNOSIS — L281 Prurigo nodularis: Secondary | ICD-10-CM | POA: Diagnosis not present

## 2023-03-13 NOTE — Progress Notes (Signed)
Patient here today to start Nemluvio injections for prurigo nodularis.    Nemluvio 60mg  injected into right and left arm. Patient tolerated well.    Pen #1) LOT: [id number] EXP: 07/2024  Pen#2) LOT: [id number] EXP: 09/2024   Dorathy Daft, RMA

## 2023-03-13 NOTE — Telephone Encounter (Signed)
Okay to enter orders for [facility] every 4 weeks until July follow up? Not done at last office visit.   Patient has decided to hold off on surgery for cyst of the eye due to scheduling conflicts with our office & patient declines going to [facility] for Dr. Caralyn Guile. aw

## 2023-03-13 NOTE — Telephone Encounter (Signed)
Patient is going to hold scheduling surgery at this time. aw

## 2023-03-13 NOTE — Telephone Encounter (Signed)
Please disregard, orders in EPIC. aw

## 2023-03-22 ENCOUNTER — Emergency Department: Payer: Worker's Compensation

## 2023-03-22 ENCOUNTER — Encounter: Payer: Self-pay | Admitting: Emergency Medicine

## 2023-03-22 ENCOUNTER — Other Ambulatory Visit: Payer: Self-pay

## 2023-03-22 ENCOUNTER — Emergency Department
Admission: EM | Admit: 2023-03-22 | Discharge: 2023-03-22 | Disposition: A | Discharge status: Home / Self Care | Payer: Worker's Compensation | Attending: Emergency Medicine | Admitting: Emergency Medicine

## 2023-03-22 DIAGNOSIS — Y990 Civilian activity done for income or pay: Secondary | ICD-10-CM | POA: Diagnosis not present

## 2023-03-22 DIAGNOSIS — W0110XA Fall on same level from slipping, tripping and stumbling with subsequent striking against unspecified object, initial encounter: Secondary | ICD-10-CM | POA: Insufficient documentation

## 2023-03-22 DIAGNOSIS — E119 Type 2 diabetes mellitus without complications: Secondary | ICD-10-CM | POA: Diagnosis not present

## 2023-03-22 DIAGNOSIS — M549 Dorsalgia, unspecified: Secondary | ICD-10-CM | POA: Diagnosis present

## 2023-03-22 DIAGNOSIS — I10 Essential (primary) hypertension: Secondary | ICD-10-CM | POA: Insufficient documentation

## 2023-03-22 DIAGNOSIS — M7918 Myalgia, other site: Secondary | ICD-10-CM

## 2023-03-22 DIAGNOSIS — S0990XA Unspecified injury of head, initial encounter: Secondary | ICD-10-CM | POA: Diagnosis not present

## 2023-03-22 MED ORDER — TRAMADOL HCL 50 MG PO TABS
50.0000 mg | ORAL_TABLET | Freq: Once | ORAL | Status: AC
  Administered 2023-03-22: 50 mg via ORAL
  Filled 2023-03-22: qty 1

## 2023-03-22 MED ORDER — TRAMADOL HCL 50 MG PO TABS: 50.0000 mg | ORAL_TABLET | Freq: Four times a day (QID) | ORAL | 0 refills | Status: AC | PRN

## 2023-03-22 NOTE — ED Provider Triage Note (Signed)
Emergency Medicine Provider Triage Evaluation Note  Jill Holmes, a 68 y.o. female  was evaluated in triage.  Pt complains of mechanical fall at work. She reports she slipped and fell, landing backward. She hit her on the floor of the [employer]. She denies LOC, N/V, dizziness. She notes the incident happened at approximately 12:30p. She also endorses pain to the lower back and buttocks.   Review of Systems  Positive: Head injury, LBP, coccyx pain Negative: Loc, nv  Physical Exam  There were no vitals taken for this visit. Gen:   Awake, no distress  nad Resp:  Normal effort cta MSK:   Moves extremities without difficulty  Other:  A&o X 4   Medical Decision Making  Medically screening exam initiated at 4:47 PM.  Appropriate orders placed.  GISELA LEA was informed that the remainder of the evaluation will be completed by another provider, this initial triage assessment does not replace that evaluation, and the importance of remaining in the ED until their evaluation is complete.  Geriatric patient to the ED for evaluation of injuries related to a mechanical fall.    Lissa Hoard, PA-C 03/22/23 1652

## 2023-03-22 NOTE — ED Provider Notes (Signed)
[facility] Provider Note    Event Date/Time   First MD Initiated Contact with Patient 03/22/23 2014     (approximate)   History   Fall   HPI  Jill Holmes is a 68 y.o. female  with history of diabetes, hypertension and as listed in EMR presents to the emergency department for evaluation after she slipped at work and fell.  She having pain in her back and left ribs.  She denies loss of consciousness.     Physical Exam   Triage Vital Signs: ED Triage Vitals  Encounter Vitals Group     BP 03/22/23 1737 (!) 162/79     Systolic BP Percentile --      Diastolic BP Percentile --      Pulse Rate 03/22/23 1737 77     Resp 03/22/23 1737 16     Temp 03/22/23 1737 98.1 F (36.7 C)     Temp src --      SpO2 03/22/23 1737 100 %     Weight 03/22/23 1735 223 lb (101.2 kg)     Height 03/22/23 1735 5\' 6"  (1.676 m)     Head Circumference --      Peak Flow --      Pain Score 03/22/23 1735 10     Pain Loc --      Pain Education --      Exclude from Growth Chart --     Most recent vital signs: Vitals:   03/22/23 1737  BP: (!) 162/79  Pulse: 77  Resp: 16  Temp: 98.1 F (36.7 C)  SpO2: 100%    General: Awake, no distress.  CV:  Good peripheral perfusion.  Resp:  Normal effort.  Abd:  No distention.  Other:  No midline tenderness over the cervical vertebrae..  Diffuse tenderness over the lower back.  Diffuse tenderness over the left lateral ribs   ED Results / Procedures / Treatments   Labs (all labs ordered are listed, but only abnormal results are displayed) Labs Reviewed - No data to display   EKG  Not indicated   RADIOLOGY  Image and radiology report reviewed and interpreted by me. Radiology report consistent with the same.  CT of the head and cervical spine are negative for acute concerns.  Imaging of the chest with left rib focus are negative for fracture or other acute concerns. Imaging of the lumbar spine is negative for acute  findings.  PROCEDURES:  Critical Care performed: No  Procedures   MEDICATIONS ORDERED IN ED:  Medications  traMADol (ULTRAM) tablet 50 mg (50 mg Oral Given 03/22/23 2109)     IMPRESSION / MDM / ASSESSMENT AND PLAN / ED COURSE   I have reviewed the triage note.  Differential diagnosis includes, but is not limited to, head injury, cervical spine injury, cervical vertebral injury, lumbar vertebral injury, rib fracture  Patient's presentation is most consistent with acute illness / injury with system symptoms.  68 year old female presenting to the emergency department after mechanical, nonsyncopal fall at work today.  See HPI for further details.  Exam and imaging is reassuring.  Her CT of her head and cervical spine are both negative for acute concerns as is the image of her ribs and lumbar spine.  Patient feels reassured by the results and will be discharged home.      FINAL CLINICAL IMPRESSION(S) / ED DIAGNOSES   Final diagnoses:  Minor head injury, initial encounter  Musculoskeletal pain  Rx / DC Orders   ED Discharge Orders          Ordered    traMADol (ULTRAM) 50 MG tablet  Every 6 hours PRN        03/22/23 2049             Note:  This document was prepared using Dragon voice recognition software and may include unintentional dictation errors.   Chinita Pester, FNP 03/23/23 2314    Concha Se, MD 03/23/23 [phone]

## 2023-03-22 NOTE — ED Triage Notes (Signed)
Pt to ED via [facility]. Pt states that she was at work and she slipped on the rug and landed on her back and head. Pt is having pain in her back and ribs. Pt states that her head does not hurt it just feels numb.

## 2023-03-22 NOTE — ED Triage Notes (Signed)
Fall, c/o back pain, rib pain, hit head.  No LOC.  Referred to ED for eval.from [facility]

## 2023-03-22 NOTE — Discharge Instructions (Addendum)
Please take the medication as prescribed.  Be aware that it may make you dizzy or sleepy.  Do not drive or operate machinery while taking it.  If you are not feeling better over the week, please call and schedule an appointment with your primary care provider.  If your symptoms change or worsen and you are concerned you may return to the emergency department.

## 2023-04-10 ENCOUNTER — Ambulatory Visit (INDEPENDENT_AMBULATORY_CARE_PROVIDER_SITE_OTHER): Payer: 59

## 2023-04-10 DIAGNOSIS — L209 Atopic dermatitis, unspecified: Secondary | ICD-10-CM

## 2023-04-10 NOTE — Progress Notes (Signed)
Patient here today to start Nemluvio injections for prurigo nodularis.    Nemluvio 60mg  injected into right and left arm. Patient tolerated well.    Pen #1) LOT: [id number] EXP: 07/2024   Pen#2) LOT: [id number] EXP: 09/2024   Dorathy Daft, RMA

## 2023-04-24 ENCOUNTER — Telehealth: Payer: Self-pay

## 2023-04-25 NOTE — Telephone Encounter (Signed)
 error

## 2023-05-08 ENCOUNTER — Ambulatory Visit (INDEPENDENT_AMBULATORY_CARE_PROVIDER_SITE_OTHER): Payer: 59

## 2023-05-08 ENCOUNTER — Telehealth: Payer: Self-pay

## 2023-05-08 DIAGNOSIS — L281 Prurigo nodularis: Secondary | ICD-10-CM | POA: Diagnosis not present

## 2023-05-08 DIAGNOSIS — L2081 Atopic neurodermatitis: Secondary | ICD-10-CM

## 2023-05-08 NOTE — Progress Notes (Unsigned)
 Patient here today for Nemluvio injections for prurigo nodularis.    Nemluvio 60mg  injected into right and left arm. Patient tolerated well.    Pen #1) LOT: [id number] EXP: 09/2024   Pen#2) LOT: [id number] EXP: 09/2024   Dorathy Daft, RMA

## 2023-05-08 NOTE — Telephone Encounter (Signed)
 Okay to enter orders for Jill Holmes for patient to continue?  She is scheduled for her 6 month follow up in July.

## 2023-05-09 MED ORDER — NEMOLIZUMAB-ILTO 30 MG ~~LOC~~ AUIJ
60.0000 mg | AUTO-INJECTOR | SUBCUTANEOUS | Status: AC
  Administered 2023-05-08 – 2023-08-09 (×4): 60 mg via SUBCUTANEOUS

## 2023-05-19 ENCOUNTER — Other Ambulatory Visit: Payer: Self-pay | Admitting: Podiatry

## 2023-05-30 ENCOUNTER — Telehealth: Payer: Self-pay

## 2023-05-30 NOTE — Telephone Encounter (Signed)
 Nemluvio to be delivered on 06/06/23.

## 2023-06-04 ENCOUNTER — Telehealth: Payer: Self-pay

## 2023-06-04 ENCOUNTER — Ambulatory Visit

## 2023-06-04 NOTE — Telephone Encounter (Signed)
 Patient called checking to see if we have received her Nemluvio injections discussed with patient we have not received her Nemluvio- she will call to have injections delivered here 06/07/23- rescheduled patient for 06/07/23 at 2:30

## 2023-06-07 ENCOUNTER — Ambulatory Visit (INDEPENDENT_AMBULATORY_CARE_PROVIDER_SITE_OTHER)

## 2023-06-07 DIAGNOSIS — L281 Prurigo nodularis: Secondary | ICD-10-CM | POA: Diagnosis not present

## 2023-06-07 NOTE — Progress Notes (Signed)
 Patient here today for Nemluvio injections for prurigo nodularis.   Nemluvio 30mg /mL injected SQ into the R upper arm, and Nemluvio 30mg /mL injected SQ into the L upper arm. Patient tolerated injections well. AL, CMA  NDC [phone] Lot #[id number] Exp date 09/2024

## 2023-07-05 ENCOUNTER — Ambulatory Visit

## 2023-07-05 DIAGNOSIS — L281 Prurigo nodularis: Secondary | ICD-10-CM

## 2023-07-05 NOTE — Progress Notes (Signed)
 Patient here today for Nemluvio injections for prurigo nodularis.    Nemluvio 30mg /mL injected SQ into the R upper arm, and Nemluvio 30mg /mL injected SQ into the L upper arm. Patient tolerated injections well.  NDC: 4098-1191-47 Lot: [id number] Exp: 09/2024  Jill Holmes V. Grier Leber, CMA

## 2023-08-02 ENCOUNTER — Ambulatory Visit

## 2023-08-06 ENCOUNTER — Ambulatory Visit

## 2023-08-09 ENCOUNTER — Ambulatory Visit

## 2023-08-09 DIAGNOSIS — L281 Prurigo nodularis: Secondary | ICD-10-CM | POA: Diagnosis not present

## 2023-08-09 NOTE — Progress Notes (Signed)
 Patient here today for Nemluvio  injections for prurigo nodularis.    Nemluvio  30mg /mL injected SQ into the R upper arm, and Nemluvio  30mg /mL injected SQ into the L upper arm. Patient tolerated injections well.   NDC: 1610-9604-54 Lot: [id number] Exp: 09/2024   Jill Holmes

## 2023-08-10 ENCOUNTER — Other Ambulatory Visit: Payer: Self-pay | Admitting: Internal Medicine

## 2023-08-10 DIAGNOSIS — M810 Age-related osteoporosis without current pathological fracture: Secondary | ICD-10-CM

## 2023-08-13 ENCOUNTER — Other Ambulatory Visit: Payer: Self-pay | Admitting: Internal Medicine

## 2023-08-13 DIAGNOSIS — Z1231 Encounter for screening mammogram for malignant neoplasm of breast: Secondary | ICD-10-CM

## 2023-08-30 ENCOUNTER — Encounter: Payer: Self-pay | Admitting: Dermatology

## 2023-08-30 ENCOUNTER — Ambulatory Visit: Payer: 59 | Admitting: Dermatology

## 2023-08-30 ENCOUNTER — Ambulatory Visit
Admission: RE | Admit: 2023-08-30 | Discharge: 2023-08-30 | Disposition: A | Discharge status: Home / Self Care | Source: Ambulatory Visit | Attending: Internal Medicine | Admitting: Internal Medicine

## 2023-08-30 DIAGNOSIS — L299 Pruritus, unspecified: Secondary | ICD-10-CM

## 2023-08-30 DIAGNOSIS — Z7189 Other specified counseling: Secondary | ICD-10-CM

## 2023-08-30 DIAGNOSIS — L209 Atopic dermatitis, unspecified: Secondary | ICD-10-CM

## 2023-08-30 DIAGNOSIS — S80811A Abrasion, right lower leg, initial encounter: Secondary | ICD-10-CM | POA: Diagnosis not present

## 2023-08-30 DIAGNOSIS — L281 Prurigo nodularis: Secondary | ICD-10-CM

## 2023-08-30 DIAGNOSIS — L981 Factitial dermatitis: Secondary | ICD-10-CM

## 2023-08-30 DIAGNOSIS — Z1231 Encounter for screening mammogram for malignant neoplasm of breast: Secondary | ICD-10-CM | POA: Insufficient documentation

## 2023-08-30 DIAGNOSIS — Z79899 Other long term (current) drug therapy: Secondary | ICD-10-CM

## 2023-08-30 DIAGNOSIS — L2089 Other atopic dermatitis: Secondary | ICD-10-CM

## 2023-08-30 DIAGNOSIS — L2081 Atopic neurodermatitis: Secondary | ICD-10-CM

## 2023-08-30 MED ORDER — TACROLIMUS 0.1 % EX OINT: TOPICAL_OINTMENT | Freq: Two times a day (BID) | CUTANEOUS | 2 refills | Status: AC

## 2023-08-30 NOTE — Patient Instructions (Signed)

## 2023-08-30 NOTE — Progress Notes (Signed)
   Follow-Up Visit   Subjective  Jill Holmes is a 68 y.o. female who presents for the following: 6 atopic derm has improved while on nemluvio  injections   The following portions of the chart were reviewed this encounter and updated as appropriate: medications, allergies, medical history  Review of Systems:  No other skin or systemic complaints except as noted in HPI or Assessment and Plan.  Objective  Well appearing patient in no apparent distress; mood and affect are within normal limits.  A focused examination was performed of the following areas: Arms, legs,   Relevant exam findings are noted in the Assessment and Plan.               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 Condition is improving with treatment but not currently at goal.   Atopic dermatitis (eczema) is a chronic, relapsing, pruritic condition that can significantly affect quality of life. It is often associated with allergic rhinitis and/or asthma and can require treatment with topical medications, phototherapy, or in severe cases biologic injectable medication (Dupixent ; Adbry ) or Oral JAK inhibitors.  Long term medication management.  Patient is using long term (months to years) prescription medication  to control their dermatologic condition.  These medications require periodic monitoring to evaluate for efficacy and side effects and may require periodic laboratory monitoring.  Treatment Plan:   Will continue Nemluvio  30 mg injections q 4 weeks    Continue Zoryve cream - apply thin layer topically to affected areas once daily   Continue Protopic  once daily either morning or night to affected  areas    Continue Allegra 180mg  1 tab po BID, Benadryl OTC QHS PRN, and Mupirocin  ointment apply to affected scabs or sores on body qd-bid    Continue N-acetylcysteine (NAC) 600 mg supplement three times per day to help with picking.   Recommend gentle skin care.   Reviewed risks of biologics including immunosuppression, infections, injection site reaction, and failure to improve condition. Goal is control of skin condition, not cure.  Some older biologics such as Humira and Enbrel may slightly increase risk of malignancy and may worsen congestive heart failure.  Taltz and Cosentyx may cause inflammatory bowel disease to flare. The use of biologics requires long term medication management, including periodic office visits and monitoring of blood work.   ATOPIC NEURODERMATITIS   Related Medications mupirocin  ointment (BACTROBAN ) 2 % APPLY TO ANY SCABS OR SORES 1-2 TIMES DAILY AS DIRECTED. pimecrolimus  (ELIDEL ) 1 % cream Apply topically to affected areas at legs once daily tacrolimus  (PROTOPIC ) 0.1 % ointment Apply topically 2 (two) times daily.  Return for keep nurse visit as scheduled, 6 month atopic derm follow up.  IEleanor Blush, CMA, am acting as scribe for Alm Rhyme, MD.   Documentation: I have reviewed the above documentation for accuracy and completeness, and I agree with the above.  Alm Rhyme, MD

## 2023-09-01 IMAGING — MG DIGITAL DIAGNOSTIC BILAT W/ TOMO W/ CAD
8 of 14 series · 8 of 38 positions shown · non-contrast
Comparison: Previous exam(s).

CLINICAL DATA: BI-RADS 3 follow-up of RIGHT breast calcifications

EXAM:
DIGITAL DIAGNOSTIC BILATERAL MAMMOGRAM WITH TOMOSYNTHESIS AND CAD
TECHNIQUE: Bilateral digital diagnostic mammography and breast tomosynthesis
was performed. The images were evaluated with computer-aided
detection.

[R CC]
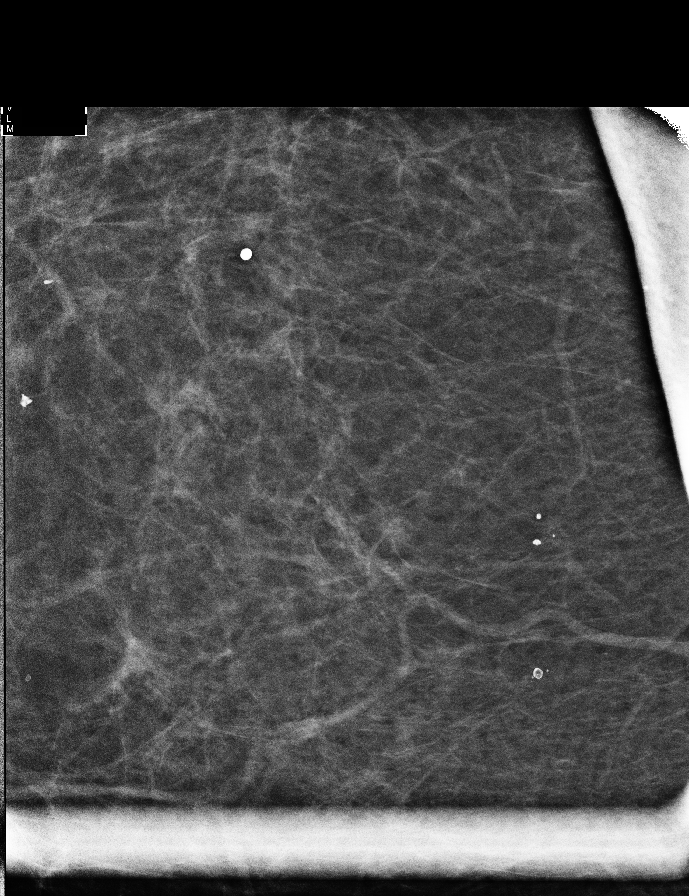

[R ML]
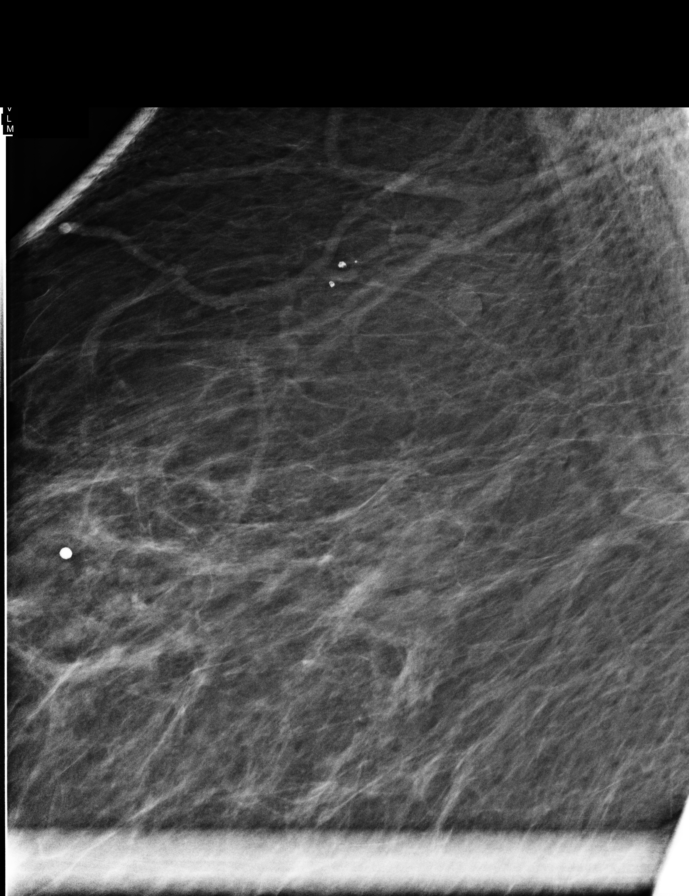

[L MLO synth-2D (1 of 2)]
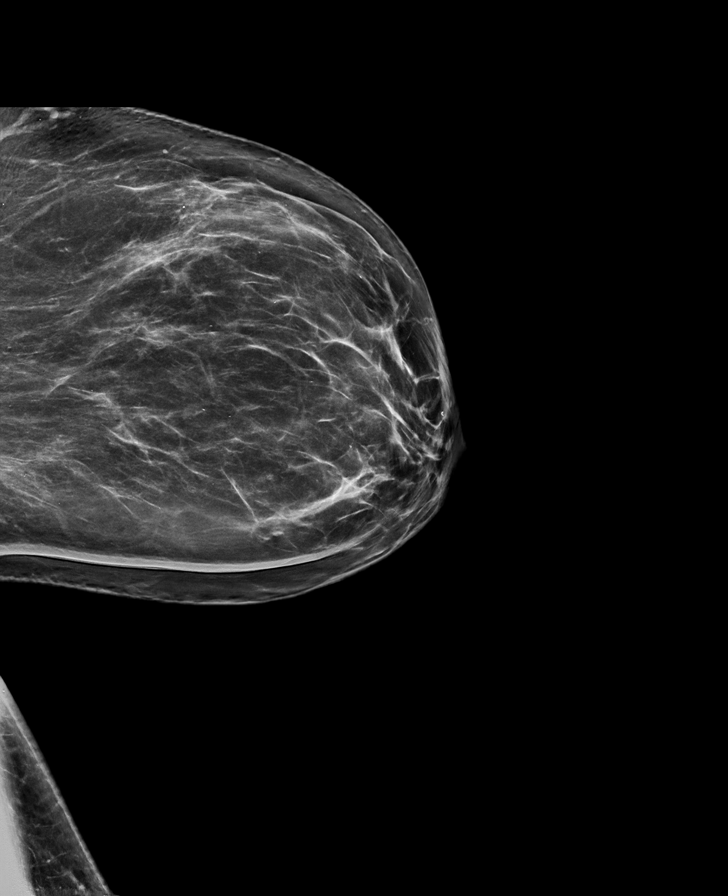

[R CC synth-2D (1 of 2)]
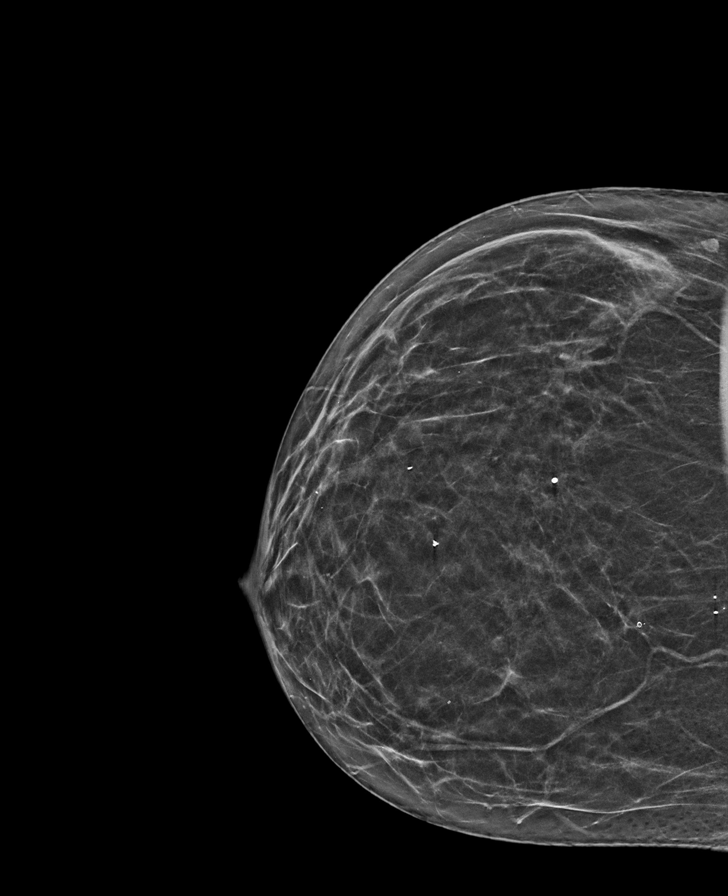

[L CC synth-2D]
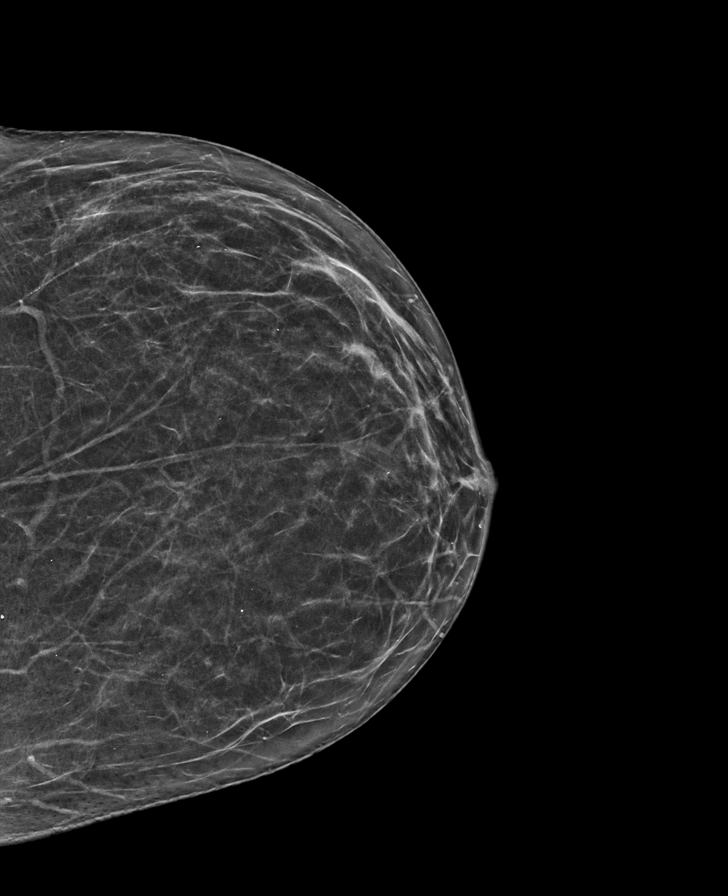

[R CC synth-2D (2 of 2)]
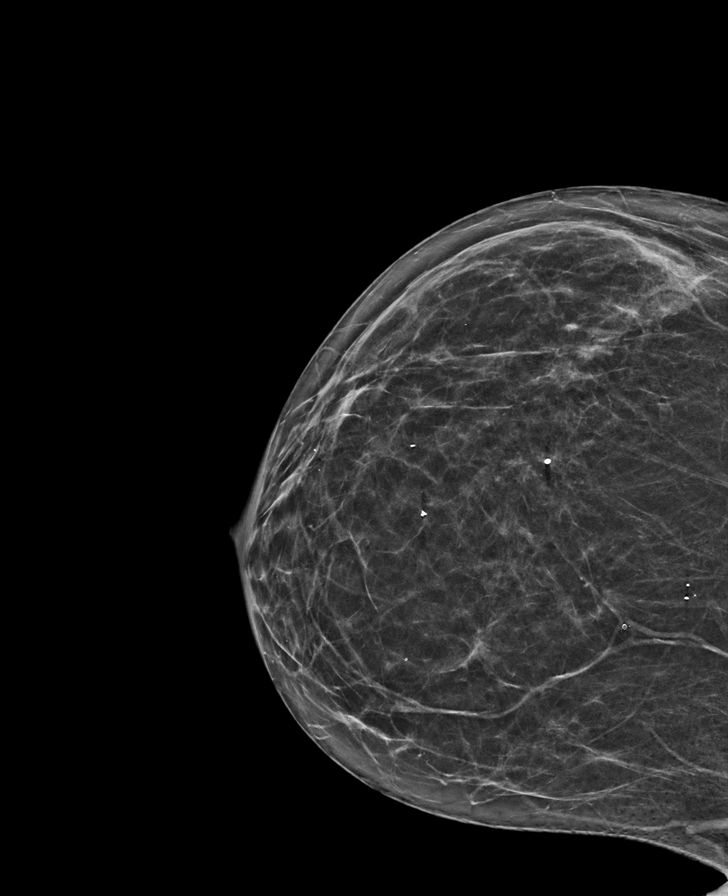

[L MLO synth-2D (2 of 2)]
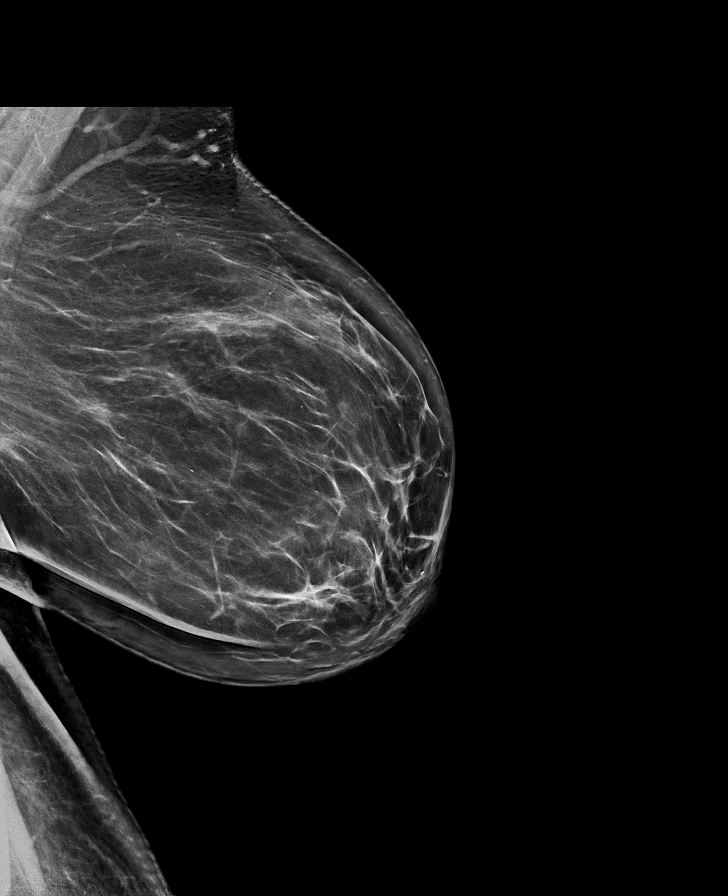

[R MLO synth-2D]
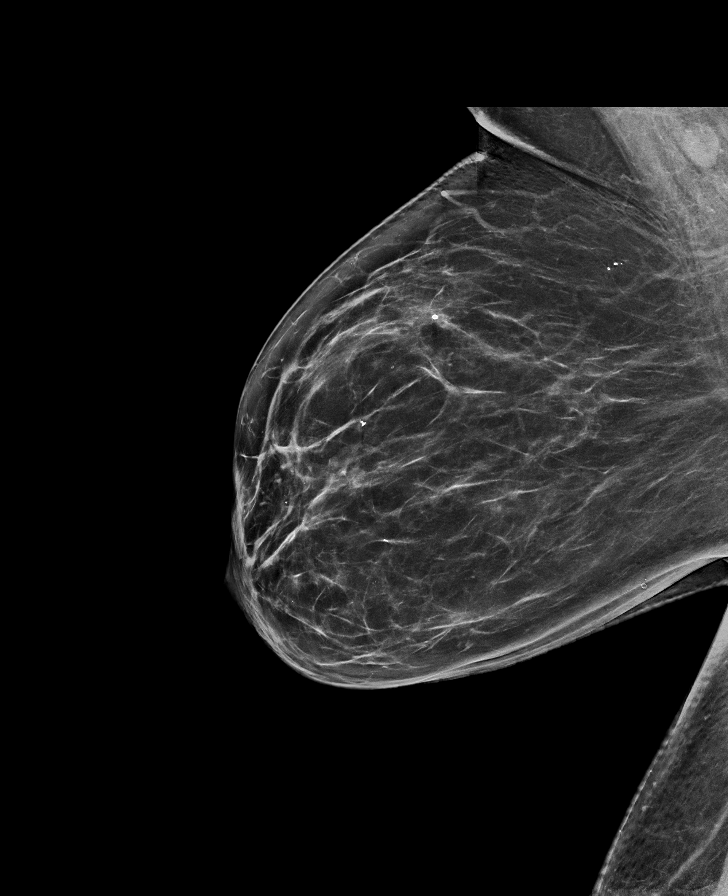

[8 of 38 positions shown; findings below may reference images not displayed]

ACR Breast Density Category b: There are scattered areas of
fibroglandular density.
FINDINGS: Spot magnification views of the RIGHT breast demonstrate progressive
coarsening of a group of calcifications in the upper breast at
posterior depth. These are consistent with benign dystrophic
calcifications. No suspicious mass, distortion, or
microcalcifications are identified to suggest presence of
malignancy.
IMPRESSION: No mammographic evidence of malignancy bilaterally. There are benign
dystrophic calcifications in the upper RIGHT breast.

RECOMMENDATION:
Screening mammogram in one year.(Code:[id number])

I have discussed the findings and recommendations with the patient.
If applicable, a reminder letter will be sent to the patient
regarding the next appointment.

BI-RADS CATEGORY  2: Benign.

## 2023-09-06 ENCOUNTER — Ambulatory Visit

## 2023-09-06 DIAGNOSIS — L2081 Atopic neurodermatitis: Secondary | ICD-10-CM

## 2023-09-06 DIAGNOSIS — L281 Prurigo nodularis: Secondary | ICD-10-CM | POA: Diagnosis not present

## 2023-09-06 MED ORDER — NEMOLIZUMAB-ILTO 30 MG ~~LOC~~ AUIJ
30.0000 mg | AUTO-INJECTOR | SUBCUTANEOUS | Status: AC
  Administered 2023-09-06 – 2024-02-28 (×6): 30 mg via SUBCUTANEOUS

## 2023-09-06 NOTE — Progress Notes (Signed)
 Patient here today for Nemluvio  injections for prurigo nodularis.    Nemluvio  30mg /mL injected SQ into the R upper arm, and Nemluvio  30mg /mL injected SQ into the L upper arm. Patient tolerated injections well.   NDC: 9700-3779-84 Lot: [id number] Exp: 10/2025   Jill Holmes

## 2023-09-17 NOTE — Progress Notes (Signed)
 I marked it cosign required and currently sitting in Dr. Lionell box to be signed off on.

## 2023-09-22 ENCOUNTER — Other Ambulatory Visit: Payer: Self-pay | Admitting: Dermatology

## 2023-09-22 DIAGNOSIS — L2081 Atopic neurodermatitis: Secondary | ICD-10-CM

## 2023-10-04 ENCOUNTER — Ambulatory Visit (INDEPENDENT_AMBULATORY_CARE_PROVIDER_SITE_OTHER)

## 2023-10-04 DIAGNOSIS — L2081 Atopic neurodermatitis: Secondary | ICD-10-CM

## 2023-10-04 NOTE — Progress Notes (Signed)
 Patient here today for Nemluvio injections for prurigo nodularis.    Nemluvio 30mg /mL injected SQ into the R upper arm, and Nemluvio 30mg /mL injected SQ into the L upper arm. Patient tolerated injections well.   NDC: 9700-3779-84 Lot: [id number] Exp: 11/2025   Jill Holmes

## 2023-11-01 ENCOUNTER — Ambulatory Visit
Admission: RE | Admit: 2023-11-01 | Discharge: 2023-11-01 | Disposition: A | Discharge status: Home / Self Care | Source: Ambulatory Visit | Attending: Internal Medicine | Admitting: Internal Medicine

## 2023-11-01 DIAGNOSIS — M810 Age-related osteoporosis without current pathological fracture: Secondary | ICD-10-CM | POA: Insufficient documentation

## 2023-11-05 ENCOUNTER — Ambulatory Visit

## 2023-11-22 ENCOUNTER — Telehealth: Payer: Self-pay

## 2023-11-22 NOTE — Telephone Encounter (Signed)
 Patient calling to check on status of her injections, she is starting to breakout again

## 2023-11-26 NOTE — Telephone Encounter (Signed)
 Jill Holmes left patient detailed VM to call [employer] for update on patient assistance.

## 2023-11-29 ENCOUNTER — Ambulatory Visit (INDEPENDENT_AMBULATORY_CARE_PROVIDER_SITE_OTHER)

## 2023-11-29 ENCOUNTER — Ambulatory Visit: Admitting: Physician Assistant

## 2023-11-29 DIAGNOSIS — M79661 Pain in right lower leg: Secondary | ICD-10-CM | POA: Diagnosis not present

## 2023-11-29 DIAGNOSIS — L2081 Atopic neurodermatitis: Secondary | ICD-10-CM | POA: Diagnosis not present

## 2023-11-29 DIAGNOSIS — M25561 Pain in right knee: Secondary | ICD-10-CM

## 2023-11-29 DIAGNOSIS — M1711 Unilateral primary osteoarthritis, right knee: Secondary | ICD-10-CM | POA: Diagnosis not present

## 2023-11-29 MED ORDER — LIDOCAINE HCL 1 % IJ SOLN
4.0000 mL | INTRAMUSCULAR | Status: AC | PRN
  Administered 2023-11-29: 4 mL

## 2023-11-29 MED ORDER — METHYLPREDNISOLONE ACETATE 40 MG/ML IJ SUSP
1.0000 mL | INTRAMUSCULAR | Status: AC | PRN
  Administered 2023-11-29: 1 mL via INTRA_ARTICULAR

## 2023-11-29 NOTE — Patient Instructions (Signed)
 [facility] [address], [place] [address] [phone]   Thank you for visiting the office today. We appreciate your trust and allowing us  to help you with your orthopedic needs.  Please do not hesitate to call if you have further questions or concerns following your visit with us . If you experience life-threatening symptoms or it cannot wait until normal office hours, please go to the nearest Emergency Department for immediate evaluation.      Cortisone Injection Patient Information  -A cortisone injection has been recommended for you today during your office visit. The injection consists of two medications administered at the same time. The first medication is a numbing medication. This medication will help you to feel better today for a couple hours.  However, this medication will wear off today and your discomfort may return. The second medication is the steroid medication. This medication will usually take a few days for full effect.   -Some individuals, about 1 in 20, can have an increase in their pain after the injection for approximately 24-36 hours. This is called a steroid flare and may be associated with some slight flushing of the face. The best thing to do if this occurs is to ice the area as much as possible and take ibuprofen if you are able.   -Any patients who are currently receiving treatment for problems with their blood sugar should be aware that this medication may cause you to have elevated blood sugar. This will typically occur for 24-36 hours after the injection. We recommend that you check your blood sugar regularly during this time and contact your primary care provider immediately or go to the nearest emergency room if the levels get to high.

## 2023-11-29 NOTE — Progress Notes (Signed)
 Orthopaedic Surgery New Patient Visit   History of Present Illness: The patient is a 68 y.o. female seen in clinic for 36-month history of right knee pain.  Patient states began when she returned to work.  Patient works in [employer] at [employer].  Return to work in September.  Patient states he has been working there since 2004.  Patient states she had mild pain and has gradually worsened.  Described as located inferior aspect of patella, deep within knee, and in calf.  Associated calf cramping. Will sometimes wake patient up at night (resolves with massage and teaspoon of mustard). 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  Patient reports couple episodes at work when there was a possible twisting injury.  Denies ever having knee effusion.  Has been using over-the-counter ibuprofen, Tylenol , and applying ice with minimal improvement. Patient does report symptom improvement with a copper compression calf brace.  Works in [employer]; spends a significant amount of time on her feet. Also reports being more active recently, having returned to work and exercising at the [facility] [employer] (including swimming and treadmill walking). 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 Also, reports varicose veins. Patient has diabetes, managed with Metformin  and glimepiride  (no insulin usage); A1c from 12/29/2021 of 7.8. Patient reports last A1c was this summer 2025 and was slightly over 7. BUN 9, Cr 0.60, and eGFR >90 on 05/20/2022. Patient on Gabapentin, believes for diabetic neuropathy.  Patient seen at the [facility] 1 month ago, on 10/26/2023, for right knee pain.  Right knee x-ray negative for fracture.  Lower extremity ultrasound negative for DVT.  Suspected ligamentous strain and advised conservative management with follow-up with orthopedist.  Patient's most  recent DEXA on 11/01/2023, revealed no osteopenia or osteoporosis. Fracture risk is increased based on history of fragility fracture. Recommended calcium and VitD supplementation.  Patient with history of left ankle fracture, when she was 108-26 years old. Required plate and screw placement. Patient also with plantar fasciiitis and capsulitis. Regularly seen by Dr. Thresa Sar with [facility]. Receives foot injections, customized orthotics, and rx for Meloxicam .   Past Medical, Social and Family History: Past Medical History:  Diagnosis Date   Cancer (HCC)    thyroid ca   Diabetes mellitus without complication (HCC)    Diverticulitis    Hypertension    Thyroid disease    Past Surgical History:  Procedure Laterality Date   ABDOMINAL HYSTERECTOMY     COLONOSCOPY WITH PROPOFOL  N/A 02/07/2019   Procedure: COLONOSCOPY WITH PROPOFOL ;  Surgeon: Jinny Carmine, MD;  Location: [facility] ENDOSCOPY;  Service: Endoscopy;  Laterality: N/A;   Allergies  Allergen Reactions   Elemental Sulfur Anaphylaxis   Enalapril Anaphylaxis   Lisinopril    Sulfa Antibiotics    Current Outpatient Medications on File Prior to Visit  Medication Sig Dispense Refill   amLODipine (NORVASC) 10 MG tablet TAKE ONE TABLET EVERY DAY 30 tablet 6   Blood Glucose Monitoring Suppl (ONETOUCH VERIO FLEX SYSTEM) w/Device KIT See admin instructions.     clotrimazole -betamethasone  (LOTRISONE ) cream APPLY TOPICALLY TWICE DAILY AS DIRECTED 30 g 6   docusate sodium  (COLACE) 100 MG capsule Take 1 capsule (100 mg total) by mouth 2 (two) times daily. 10 capsule 0   EPINEPHrine 0.3 mg/0.3 mL IJ SOAJ injection Inject 0.3 mg  into the skin.     fexofenadine (ALLEGRA) 180 MG tablet Take 180 mg by mouth in the morning, at noon, and at bedtime.     gabapentin (NEURONTIN) 100 MG capsule TAKE TWO CAPSULES THREE TIMES A DAY 540 capsule 1   glimepiride  (AMARYL ) 4 MG tablet TAKE 1 TABLET BY MOUTH DAILY BEFORE  BREAKFAST (Patient taking differently: Take 4 mg by mouth at bedtime.) 30 tablet 3   glucose blood test strip      levothyroxine (SYNTHROID) 150 MCG tablet TAKE 1 TABLET BY MOUTH DAILY 90 tablet 1   lovastatin (MEVACOR) 20 MG tablet TAKE 1 TABLET BY MOUTH DAILY 90 tablet 1   meloxicam  (MOBIC ) 15 MG tablet TAKE ONE TABLET BY MOUTH EVERY DAY 90 tablet 2   metFORMIN  (GLUCOPHAGE -XR) 500 MG 24 hr tablet TAKE 1 TABLET BY MOUTH 3 TIMES DAILY 90 tablet 1   metFORMIN  (GLUCOPHAGE -XR) 750 MG 24 hr tablet 2 tablets po q am for 100 days and 1 tab po q evening with meals for diabetes     metoprolol succinate (TOPROL-XL) 100 MG 24 hr tablet TAKE 1 TABLET BY MOUTH DAILY 90 tablet 3   mupirocin  ointment (BACTROBAN ) 2 % APPLY TO ANY SCABS OR SORES 1-2 TIMES DAILY AS DIRECTED. 30 g 3   Nemolizumab-ilto  (NEMLUVIO ) 30 MG AUIJ Inject 2 pens. into the skin every 28 (twenty-eight) days. 2 each 5   nystatin cream (MYCOSTATIN) APPLY TO AFFECTED AREAS 3 TIMES DAILY AS NEEDED     ondansetron  (ZOFRAN ) 4 MG tablet Take 1 tablet (4 mg total) by mouth daily as needed for nausea or vomiting. 20 tablet 1   pimecrolimus  (ELIDEL ) 1 % cream APPLY TOPICALLY TO AFFECTED AREAS AT LEGS ONCE A DAY 30 g 2   polyethylene glycol powder (GLYCOLAX /MIRALAX ) 17 GM/SCOOP powder Take 17 g by mouth 2 (two) times daily as needed. 3350 g 1   tacrolimus  (PROTOPIC ) 0.1 % ointment Apply topically 2 (two) times daily. 100 g 2   tralokinumab  (ADBRY ) 150 MG/ML SOSY INJECT 2 SYRINGES UNDER THE SKIN EVERY 14 DAYS 4 mL 5   traMADol  (ULTRAM ) 50 MG tablet Take 1 tablet (50 mg total) by mouth every 6 (six) hours as needed. 12 tablet 0   triamcinolone  cream (KENALOG ) 0.1 % APPLY TOPICALLY EVERY DAY. AVOID FACE, GROIN, UNDERARMS. 453.6 g 0   triamcinolone  cream (KENALOG ) 0.1 % Apply 1 Application topically 2 (two) times daily as needed. 454 g 2   Current Facility-Administered Medications on File Prior to Visit  Medication Dose Route Frequency Provider Last Rate  Last Admin   nemolizumab-ilto  (NEMLUVIO ) SQ injection 30 mg  30 mg Subcutaneous Q28 days Hester Alm BROCKS, MD   30 mg at 10/04/23 9062   Social History   Tobacco Use   Smoking status: Never   Smokeless tobacco: Never  Substance Use Topics   Alcohol use: No   Drug use: No      I have reviewed past medical, surgical, social and family history, medications and allergies as documented in the EMR.  Review of Systems - A ROS was performed including pertinent positives and negatives as documented in the HPI.     Physical Exam:  General/Constitutional: NAD Vascular: No edema, swelling or tenderness, except as noted in detailed exam Integumentary: No impressive skin lesions present, except as noted in detailed exam Neuro/Psych: Normal mood and affect, oriented to person, place and time Musculoskeletal: Normal, except as noted in detailed exam and in HPI   Focused examination:  Knee Examination (focused):   RIGHT  AROM (degrees) PROM (degrees)  0 - 135 0 - 135  Palpation (pain): Effusion none   Medial joint line tenderness positive   Lateral joint line tenderness negative  Instability: Varus @ 0 degrees            @ 30 degrees none none   Valgus @ 0 degrees             @ 30 degrees none none  Special Tests: Lachman's negative   Posterior drawer none   Anterior drawer none   McMurray negative  Patella: Palpation (pain) positive   Mobility < 2 quadrants   Apprehension negative  Other: Knee flexion strength   5/5   Knee extension strength  5/5   Focal areas of tenderness to palpation: Right knee normal to inspection. No erythema, ecchymosis, or effusion/swelling. Mild tenderness with palpation over lateral aspect and pes anserine. Mild tenderness with palpation over posterior aspect of knee and with palpation/squeezing of calf.  Vascular/Lymphatic: 2+ dorsalis pedis/posterior tibialis pulses,  foot warm and well perfused Neurologic: Sensation intact to light touch to  Superficial peroneal/Deep peroneal/Tibial/Sural/Saphenous nerves          XR Right Knee Imaging: X-rays of the right knee including 2-views (AP and lateral) obtained today 11/29/2023 at [facility] were reviewed personally by me.  Per my independent interpretation these images show significant loss of medial joint space. Osteophyte spurring present on medial and lateral aspect of tibial plateau. Spurring at superior aspect of patella at patellofemoral joint and quadriceps tendon attachment.  Radiology Read:  10/26/2023 at [facility] Venous Duplex Lower Extremity Right  Final Result   XR Knee 4 Or More Views Right  Final Result  No acute fracture or dislocation.  Tricompartmental knee osteoarthritis, slightly progressed since 2023.   Previous knee imaging CLINICAL DATA:  Chronic pain   EXAM: RIGHT KNEE - COMPLETE 4+ VIEW   COMPARISON:  None.   FINDINGS: Standing frontal, standing lateral, tunnel, and sunrise patellar views were obtained no fracture or dislocation. No appreciable joint effusion. There is moderate narrowing of the patellofemoral joint. There is mild narrowing medially. There is spurring in all compartments. There is spurring along the anterior superior patella.   IMPRESSION: Osteoarthritic change, most severe in the patellofemoral joint. Spurring in all compartments. Spurring along the anterior superior patella likely represents distal quadriceps tendinosis. No evident fracture, dislocation, or joint effusion.     Electronically Signed   By: Elsie Repine III M.D.   On: 04/07/2019 08:15  Assessment:  Right knee osteoarthritis Right calf pain  Procedure: The risks and benefits of a cortisone injection were discussed and the patient wishes to proceed.  The lateral compartment of the right knee was cleansed with alcohol swabs and ChloraPrep.  The skin was flash cooled with Ethyl Chloride,  and using sterile technique, a 20 gauge needle was immediately introduced into the joint.  After aspiration revealed a flash of clear yellow tinged joint fluid the injectate which consisted of 1cc of 40 mg/mL of Kenalog  and 4cc of 0.5% ropivacaine easily flowed into the joint.  The needle was withdrawn and pressure was applied for hemostasis.  A bandage was applied.  The patient tolerated the procedure well. Patient instructed to ice the area tonight if sore.  Cortisone Injections You have received a cortisone injection today. This injection consists of a numbing medication and cortisone.  There may be side  effects after receiving the injection.   If you are diabetic: Your blood sugar may increase for up to 48 hours.  Check it more often than normal.   General reactions: A cortisone flare reaction. This generally occurs 6-8 hours after receiving the injection.  Ice the area and take Tylenol  for pain.  If the pain lasts longer than 48 hours call the office.   Some patients may experience flushing, increased heart rate, red face or increase in body temperature.  This is rare but can happen.   Over the counter Benadryl, if appropriate, often reduces these symptoms.  For a severe reaction contact your family doctor or go to the emergency room.  If you have any other questions, feel free to ask.   Large Joint Inj on 11/29/2023 11:50 AM Indications: pain Details: 22 G 1.5 in needle, lateral approach Medications: 4 mL lidocaine  1 %; 1 mL methylPREDNISolone acetate 40 MG/ML Outcome: tolerated well, no immediate complications Procedure, treatment alternatives, risks and benefits explained, specific risks discussed. Consent was given by the patient. Immediately prior to procedure a time out was called to verify the correct patient, procedure, equipment, support staff and site/side marked as required. Patient was prepped and draped in the usual sterile fashion.        Plan:  Patient was seen and  examined in office today. We reviewed patient's history, examination, and imaging in detail. Based on information available for this encounter, believe patient's right lower leg pain is multifactorial.  Patient does have moderate tricompartmental osteoarthritis of the right knee.  Advised continuation of rest, knee brace, prescription meloxicam .  Patient declined referral to formal physical therapy.  Provided chronic right knee pain stretching/strengthening exercises.  Patient stated she would call if she changed her mind and wanted a referral for formal physical therapy.  Patient given intra-articular right knee corticosteroid injection today.  Discussed risk versus benefits.  Patient has had significant symptom improvement from previous knee injection.  Patient's A1c currently controlled.  Patient did report recent use of recliner to sleep at night.  States due to anxiety in the evening and also some shortness of breath.  Patient with history of hypertension and hyperlipidemia.  Discussed further evaluation of possible CHF, given nocturnal dyspnea/shortness of breath and peripheral edema.  Patient denies cardiac history.  Patient is scheduled to follow-up in November with vascular specialist for further evaluation of calf pain, varicose veins, and lower extremity swelling.  Patient with recent right lower extremity ultrasound that revealed no DVT.  Patient also during visit reported right hand paresthesias.  Advised patient when she followed up in 6 weeks for reevaluation of right knee, may be able to discuss right hand numbness/paresthesias for further evaluation-such as cervical radiculopathy, cubital tunnel, carpal tunnel.  Patient states that she does have right wrist ganglion cyst.  Patient currently on calcium supplementation. Did discuss taking a VitD supplement; recommended 800IU/qd.   Patient education material was provided.  All questions, concerns and comments were addressed to the best of my  ability.  Follow-up: 6 weeks.   Lucie Stabs, PA-C Orthopedic Surgery & Sports Medicine [place]   This document was dictated using Dragon voice recognition software. A reasonable attempt at proof reading has been made to minimize errors.

## 2023-11-29 NOTE — Progress Notes (Signed)
 Patient here today for Nemluvio  injections for prurigo nodularis.    Nemluvio  30mg /mL injected SQ into the R upper arm, and Nemluvio  30mg /mL injected SQ into the L upper arm. Patient tolerated injections well.  Patient is working with [employer] for patient assistance program. She has made contact with them, her information is processing and patient should be receiving a call back soon.    NDC: 9700-3779-89 Lot: [id number] Exp: 01/2026   Jill Holmes

## 2023-12-05 ENCOUNTER — Telehealth: Payer: Self-pay

## 2023-12-05 NOTE — Telephone Encounter (Signed)
 Spoke with our Manufacturing engineer for help regarding patient's case. She is currently working on PAP since she used all her funds on her copay savings card.   I have called patient and asked for her to return my call.  I need to refax her insurance card with clear member ID to [id number] and enroll her through [email or web address]

## 2023-12-13 ENCOUNTER — Other Ambulatory Visit: Payer: Self-pay

## 2023-12-13 MED ORDER — NEMLUVIO 30 MG ~~LOC~~ AUIJ: 60.0000 mg | AUTO-INJECTOR | SUBCUTANEOUS | 5 refills | Status: AC

## 2023-12-13 NOTE — Progress Notes (Signed)
 Transfer of RX per fax request. aw

## 2023-12-17 ENCOUNTER — Telehealth: Payer: Self-pay

## 2023-12-17 NOTE — Telephone Encounter (Signed)
 Patient called Jill Holmes on VM asking we could call her pharmacy [phone] to have Nemluvio  injection sent to our office,   I called her pharmacy 2 times and  was left on hold for 30 minutes each time no one never answered my call.   I called patient Jill Holmes on VM to let her know I could not get anyone on the phone at her pharmacy if she could call them have them call us  or they could fax something here.

## 2023-12-24 ENCOUNTER — Encounter: Payer: Self-pay | Admitting: Radiology

## 2023-12-27 ENCOUNTER — Ambulatory Visit

## 2023-12-27 DIAGNOSIS — L2081 Atopic neurodermatitis: Secondary | ICD-10-CM | POA: Diagnosis not present

## 2023-12-27 NOTE — Progress Notes (Signed)
 Patient here today for Nemluvio  injections for prurigo nodularis.    Nemluvio  30mg /mL injected SQ into the R upper arm, and Nemluvio  30mg /mL injected SQ into the L upper arm. Patient tolerated injections well.   Patient is working with [employer] for patient assistance program. She has made contact with them, her information is processing and patient should be receiving a call back soon.    NDC: 9700-3779-89 1st Box: Lot: [id number] Exp: 01/2026 2nd box: LOT: [id number] Exp: 07/2024  Alan Pizza, RMA

## 2023-12-31 ENCOUNTER — Other Ambulatory Visit (INDEPENDENT_AMBULATORY_CARE_PROVIDER_SITE_OTHER): Payer: Self-pay | Admitting: Vascular Surgery

## 2023-12-31 DIAGNOSIS — I83893 Varicose veins of bilateral lower extremities with other complications: Secondary | ICD-10-CM

## 2024-01-01 ENCOUNTER — Other Ambulatory Visit (INDEPENDENT_AMBULATORY_CARE_PROVIDER_SITE_OTHER): Payer: Self-pay

## 2024-01-01 ENCOUNTER — Ambulatory Visit (INDEPENDENT_AMBULATORY_CARE_PROVIDER_SITE_OTHER): Payer: Self-pay | Admitting: Nurse Practitioner

## 2024-01-01 ENCOUNTER — Encounter (INDEPENDENT_AMBULATORY_CARE_PROVIDER_SITE_OTHER): Payer: Self-pay | Admitting: Nurse Practitioner

## 2024-01-01 VITALS — BP 143/79 | HR 68 | Resp 18 | Wt 216.4 lb

## 2024-01-01 DIAGNOSIS — E1169 Type 2 diabetes mellitus with other specified complication: Secondary | ICD-10-CM

## 2024-01-01 DIAGNOSIS — I83893 Varicose veins of bilateral lower extremities with other complications: Secondary | ICD-10-CM

## 2024-01-01 DIAGNOSIS — I10 Essential (primary) hypertension: Secondary | ICD-10-CM

## 2024-01-06 ENCOUNTER — Encounter (INDEPENDENT_AMBULATORY_CARE_PROVIDER_SITE_OTHER): Payer: Self-pay | Admitting: Nurse Practitioner

## 2024-01-06 NOTE — Progress Notes (Incomplete)
 Subjective:    Patient ID: Jill Holmes, female    DOB: 1956-01-19, 68 y.o.   MRN: [id number] Chief Complaint  Patient presents with  . New Patient (Initial Visit)    Ref Entzminger consult symptomatic varicose veins of both lower extremities     HPI  Review of Systems     Objective:   Physical Exam  BP (!) 143/79 (BP Location: Left Arm)   Pulse 68   Resp 18   Wt 216 lb 6.4 oz (98.2 kg)   BMI 34.93 kg/m   Past Medical History:  Diagnosis Date  . Cancer (HCC)    thyroid ca  . Diabetes mellitus without complication (HCC)   . Diverticulitis   . Hypertension   . Thyroid disease     Social History   Socioeconomic History  . Marital status: Married    Spouse name: Not on file  . Number of children: Not on file  . Years of education: Not on file  . Highest education level: Not on file  Occupational History  . Not on file  Tobacco Use  . Smoking status: Never  . Smokeless tobacco: Never  Substance and Sexual Activity  . Alcohol use: No  . Drug use: No  . Sexual activity: Yes    Birth control/protection: None  Other Topics Concern  . Not on file  Social History Narrative  . Not on file   Social Drivers of Health   Financial Resource Strain: Not on file  Food Insecurity: Not on file  Transportation Needs: Not on file  Physical Activity: Not on file  Stress: Not on file  Social Connections: Not on file  Intimate Partner Violence: Not on file    Past Surgical History:  Procedure Laterality Date  . ABDOMINAL HYSTERECTOMY    . COLONOSCOPY WITH PROPOFOL  N/A 02/07/2019   Procedure: COLONOSCOPY WITH PROPOFOL ;  Surgeon: Jinny Carmine, MD;  Location: [facility] ENDOSCOPY;  Service: Endoscopy;  Laterality: N/A;    Family History  Problem Relation Age of Onset  . Breast cancer Mother 76    Allergies  Allergen Reactions  . Elemental Sulfur Anaphylaxis  . Enalapril Anaphylaxis  . Lisinopril   . Sulfa Antibiotics        Latest Ref Rng & Units 08/02/2021    10:06 AM 09/10/2020   10:22 AM 09/12/2019   11:52 AM  CBC  WBC 3.8 - 10.8 Thousand/uL 5.6  5.6  5.2   Hemoglobin 11.7 - 15.5 g/dL 88.4  87.0  86.4   Hematocrit 35.0 - 45.0 % 36.9  39.5  39.9   Platelets 140 - 400 Thousand/uL 299  251  256       CMP     Component Value Date/Time   NA 138 08/02/2021 1006   NA 138 10/26/2013 0520   K 4.2 08/02/2021 1006   K 3.7 10/26/2013 0520   CL 100 08/02/2021 1006   CL 101 10/26/2013 0520   CO2 26 08/02/2021 1006   CO2 29 10/26/2013 0520   GLUCOSE 161 (H) 08/02/2021 1006   GLUCOSE 202 (H) 10/26/2013 0520   BUN 14 08/02/2021 1006   BUN 14 10/26/2013 0520   CREATININE 0.74 08/02/2021 1006   CALCIUM 9.4 08/02/2021 1006   CALCIUM 8.8 10/26/2013 0520   PROT 7.4 08/02/2021 1006   PROT 7.2 10/15/2013 0445   ALBUMIN 4.0 10/26/2014 0035   ALBUMIN 3.5 10/15/2013 0445   AST 24 08/02/2021 1006   AST 42 (H) 10/15/2013 0445  ALT 22 08/02/2021 1006   ALT 43 10/15/2013 0445   ALKPHOS 48 10/26/2014 0035   ALKPHOS 52 10/15/2013 0445   BILITOT 0.7 08/02/2021 1006   BILITOT 0.7 10/15/2013 0445   EGFR 90 08/02/2021 1006   GFRNONAA 97 09/12/2019 1152     No results found.     Assessment & Plan:   1. Varicose veins of bilateral lower extremities with other complications (Primary) ***  2. Primary hypertension ***  3. Type 2 diabetes mellitus with other specified complication, without long-term current use of insulin (HCC) ***   Current Outpatient Medications on File Prior to Visit  Medication Sig Dispense Refill  . amLODipine (NORVASC) 10 MG tablet TAKE ONE TABLET EVERY DAY 30 tablet 6  . Blood Glucose Monitoring Suppl (ONETOUCH VERIO FLEX SYSTEM) w/Device KIT See admin instructions.    . clotrimazole -betamethasone  (LOTRISONE ) cream APPLY TOPICALLY TWICE DAILY AS DIRECTED 30 g 6  . EPINEPHrine 0.3 mg/0.3 mL IJ SOAJ injection Inject 0.3 mg into the skin.    . fexofenadine (ALLEGRA) 180 MG tablet Take 180 mg by mouth in the morning, at noon,  and at bedtime.    . gabapentin (NEURONTIN) 100 MG capsule TAKE TWO CAPSULES THREE TIMES A DAY 540 capsule 1  . glimepiride  (AMARYL ) 4 MG tablet TAKE 1 TABLET BY MOUTH DAILY BEFORE BREAKFAST (Patient taking differently: Take 4 mg by mouth at bedtime.) 30 tablet 3  . glucose blood test strip     . levothyroxine (SYNTHROID) 150 MCG tablet TAKE 1 TABLET BY MOUTH DAILY 90 tablet 1  . lovastatin (MEVACOR) 20 MG tablet TAKE 1 TABLET BY MOUTH DAILY 90 tablet 1  . meloxicam  (MOBIC ) 15 MG tablet TAKE ONE TABLET BY MOUTH EVERY DAY 90 tablet 2  . metFORMIN  (GLUCOPHAGE -XR) 750 MG 24 hr tablet 2 tablets po q am for 100 days and 1 tab po q evening with meals for diabetes    . metoprolol succinate (TOPROL-XL) 100 MG 24 hr tablet TAKE 1 TABLET BY MOUTH DAILY 90 tablet 3  . mupirocin  ointment (BACTROBAN ) 2 % APPLY TO ANY SCABS OR SORES 1-2 TIMES DAILY AS DIRECTED. 30 g 3  . nemolizumab-ilto  (NEMLUVIO ) 30 MG SQ injection Inject 60 mg into the skin every 28 (twenty-eight) days. 2 each 5  . nystatin cream (MYCOSTATIN) APPLY TO AFFECTED AREAS 3 TIMES DAILY AS NEEDED    . ondansetron  (ZOFRAN ) 4 MG tablet Take 1 tablet (4 mg total) by mouth daily as needed for nausea or vomiting. 20 tablet 1  . pimecrolimus  (ELIDEL ) 1 % cream APPLY TOPICALLY TO AFFECTED AREAS AT LEGS ONCE A DAY 30 g 2  . polyethylene glycol powder (GLYCOLAX /MIRALAX ) 17 GM/SCOOP powder Take 17 g by mouth 2 (two) times daily as needed. 3350 g 1  . tacrolimus  (PROTOPIC ) 0.1 % ointment Apply topically 2 (two) times daily. 100 g 2  . traMADol  (ULTRAM ) 50 MG tablet Take 1 tablet (50 mg total) by mouth every 6 (six) hours as needed. 12 tablet 0  . docusate sodium  (COLACE) 100 MG capsule Take 1 capsule (100 mg total) by mouth 2 (two) times daily. (Patient not taking: Reported on 01/01/2024) 10 capsule 0  . metFORMIN  (GLUCOPHAGE -XR) 500 MG 24 hr tablet TAKE 1 TABLET BY MOUTH 3 TIMES DAILY 90 tablet 1  . tralokinumab  (ADBRY ) 150 MG/ML SOSY INJECT 2 SYRINGES UNDER  THE SKIN EVERY 14 DAYS 4 mL 5  . triamcinolone  cream (KENALOG ) 0.1 % APPLY TOPICALLY EVERY DAY. AVOID FACE, GROIN, UNDERARMS. 453.6  g 0  . triamcinolone  cream (KENALOG ) 0.1 % Apply 1 Application topically 2 (two) times daily as needed. 454 g 2   Current Facility-Administered Medications on File Prior to Visit  Medication Dose Route Frequency Provider Last Rate Last Admin  . nemolizumab-ilto  (NEMLUVIO ) SQ injection 30 mg  30 mg Subcutaneous Q28 days Hester Alm BROCKS, MD   30 mg at 12/27/23 1442    There are no Patient Instructions on file for this visit. No follow-ups on file.   Alger Kerstein E Aldene Hendon, NP

## 2024-01-06 NOTE — Progress Notes (Signed)
 Subjective:    Patient ID: Jill Holmes, female    DOB: 1956-01-29, 68 y.o.   MRN: [id number] Chief Complaint  Patient presents with   New Patient (Initial Visit)    Ref Entzminger consult symptomatic varicose veins of both lower extremities     The patient is seen for evaluation of symptomatic varicose veins. The patient relates burning and stinging which worsened steadily throughout the course of the day, particularly with standing. The patient also notes an aching and throbbing pain over the varicosities, particularly with prolonged dependent positions. The symptoms are significantly improved with elevation.  The patient also notes that during hot weather the symptoms are greatly intensified. The patient states the pain from the varicose veins interferes with work, daily exercise, shopping and household maintenance. At this point, the symptoms are persistent and severe enough that they're having a negative impact on lifestyle and are interfering with daily activities.  There is no history of DVT, PE or superficial thrombophlebitis. There is no history of ulceration or hemorrhage. The patient denies a significant family history of varicose veins.  The patient has not worn graduated compression in the past. At the present time the patient has not been using over-the-counter analgesics. There is no history of prior surgical intervention or sclerotherapy.  The patient is evidence of venous reflux in the right great saphenous vein.  No evidence of DVT or superficial phlebitis noted    Review of Systems  Cardiovascular:  Positive for leg swelling.  All other systems reviewed and are negative.      Objective:   Physical Exam Vitals reviewed.  HENT:     Head: Normocephalic.  Cardiovascular:     Rate and Rhythm: Normal rate.  Pulmonary:     Effort: Pulmonary effort is normal.  Skin:    General: Skin is warm and dry.  Neurological:     Mental Status: She is alert and oriented to  person, place, and time.  Psychiatric:        Mood and Affect: Mood normal.        Behavior: Behavior normal.        Thought Content: Thought content normal.        Judgment: Judgment normal.     BP (!) 143/79 (BP Location: Left Arm)   Pulse 68   Resp 18   Wt 216 lb 6.4 oz (98.2 kg)   BMI 34.93 kg/m   Past Medical History:  Diagnosis Date   Cancer (HCC)    thyroid ca   Diabetes mellitus without complication (HCC)    Diverticulitis    Hypertension    Thyroid disease     Social History   Socioeconomic History   Marital status: Married    Spouse name: Not on file   Number of children: Not on file   Years of education: Not on file   Highest education level: Not on file  Occupational History   Not on file  Tobacco Use   Smoking status: Never   Smokeless tobacco: Never  Substance and Sexual Activity   Alcohol use: No   Drug use: No   Sexual activity: Yes    Birth control/protection: None  Other Topics Concern   Not on file  Social History Narrative   Not on file   Social Drivers of Health   Financial Resource Strain: Not on file  Food Insecurity: Not on file  Transportation Needs: Not on file  Physical Activity: Not on file  Stress: Not  on file  Social Connections: Not on file  Intimate Partner Violence: Not on file    Past Surgical History:  Procedure Laterality Date   ABDOMINAL HYSTERECTOMY     COLONOSCOPY WITH PROPOFOL  N/A 02/07/2019   Procedure: COLONOSCOPY WITH PROPOFOL ;  Surgeon: Jinny Carmine, MD;  Location: [facility] ENDOSCOPY;  Service: Endoscopy;  Laterality: N/A;    Family History  Problem Relation Age of Onset   Breast cancer Mother 26    Allergies  Allergen Reactions   Elemental Sulfur Anaphylaxis   Enalapril Anaphylaxis   Lisinopril    Sulfa Antibiotics        Latest Ref Rng & Units 08/02/2021   10:06 AM 09/10/2020   10:22 AM 09/12/2019   11:52 AM  CBC  WBC 3.8 - 10.8 Thousand/uL 5.6  5.6  5.2   Hemoglobin 11.7 - 15.5 g/dL 88.4   87.0  86.4   Hematocrit 35.0 - 45.0 % 36.9  39.5  39.9   Platelets 140 - 400 Thousand/uL 299  251  256       CMP     Component Value Date/Time   NA 138 08/02/2021 1006   NA 138 10/26/2013 0520   K 4.2 08/02/2021 1006   K 3.7 10/26/2013 0520   CL 100 08/02/2021 1006   CL 101 10/26/2013 0520   CO2 26 08/02/2021 1006   CO2 29 10/26/2013 0520   GLUCOSE 161 (H) 08/02/2021 1006   GLUCOSE 202 (H) 10/26/2013 0520   BUN 14 08/02/2021 1006   BUN 14 10/26/2013 0520   CREATININE 0.74 08/02/2021 1006   CALCIUM 9.4 08/02/2021 1006   CALCIUM 8.8 10/26/2013 0520   PROT 7.4 08/02/2021 1006   PROT 7.2 10/15/2013 0445   ALBUMIN 4.0 10/26/2014 0035   ALBUMIN 3.5 10/15/2013 0445   AST 24 08/02/2021 1006   AST 42 (H) 10/15/2013 0445   ALT 22 08/02/2021 1006   ALT 43 10/15/2013 0445   ALKPHOS 48 10/26/2014 0035   ALKPHOS 52 10/15/2013 0445   BILITOT 0.7 08/02/2021 1006   BILITOT 0.7 10/15/2013 0445   EGFR 90 08/02/2021 1006   GFRNONAA 97 09/12/2019 1152     No results found.     Assessment & Plan:   1. Varicose veins of bilateral lower extremities with other complications (Primary)  Recommend:  The patient has large symptomatic varicose veins that are painful and associated with swelling. The patient is CEAP [id number]   I have had a long discussion with the patient regarding  varicose veins and why they cause symptoms.  Patient will begin wearing graduated compression stockings class 1 on a daily basis, beginning first thing in the morning and removing them in the evening. The patient is instructed specifically not to sleep in the stockings.    The patient  will also begin using over-the-counter analgesics such as Motrin 600 mg po TID to help control the symptoms.    In addition, behavioral modification including elevation during the day will be initiated.    Pending the results of these changes the  patient will be reevaluated in three months.   An ultrasound of the venous system  will be obtained.   Further plans will be based on the ultrasound results and whether conservative therapies are successful at eliminating the pain and swelling.   2. Primary hypertension Continue antihypertensive medications as already ordered, these medications have been reviewed and there are no changes at this time.  3. Type 2 diabetes mellitus with other specified complication,  without long-term current use of insulin (HCC) Continue hypoglycemic medications as already ordered, these medications have been reviewed and there are no changes at this time.  Hgb A1C to be monitored as already arranged by primary service   Current Outpatient Medications on File Prior to Visit  Medication Sig Dispense Refill   amLODipine (NORVASC) 10 MG tablet TAKE ONE TABLET EVERY DAY 30 tablet 6   Blood Glucose Monitoring Suppl (ONETOUCH VERIO FLEX SYSTEM) w/Device KIT See admin instructions.     clotrimazole -betamethasone  (LOTRISONE ) cream APPLY TOPICALLY TWICE DAILY AS DIRECTED 30 g 6   EPINEPHrine 0.3 mg/0.3 mL IJ SOAJ injection Inject 0.3 mg into the skin.     fexofenadine (ALLEGRA) 180 MG tablet Take 180 mg by mouth in the morning, at noon, and at bedtime.     gabapentin (NEURONTIN) 100 MG capsule TAKE TWO CAPSULES THREE TIMES A DAY 540 capsule 1   glimepiride  (AMARYL ) 4 MG tablet TAKE 1 TABLET BY MOUTH DAILY BEFORE BREAKFAST (Patient taking differently: Take 4 mg by mouth at bedtime.) 30 tablet 3   glucose blood test strip      levothyroxine (SYNTHROID) 150 MCG tablet TAKE 1 TABLET BY MOUTH DAILY 90 tablet 1   lovastatin (MEVACOR) 20 MG tablet TAKE 1 TABLET BY MOUTH DAILY 90 tablet 1   meloxicam  (MOBIC ) 15 MG tablet TAKE ONE TABLET BY MOUTH EVERY DAY 90 tablet 2   metFORMIN  (GLUCOPHAGE -XR) 750 MG 24 hr tablet 2 tablets po q am for 100 days and 1 tab po q evening with meals for diabetes     metoprolol succinate (TOPROL-XL) 100 MG 24 hr tablet TAKE 1 TABLET BY MOUTH DAILY 90 tablet 3   mupirocin   ointment (BACTROBAN ) 2 % APPLY TO ANY SCABS OR SORES 1-2 TIMES DAILY AS DIRECTED. 30 g 3   nemolizumab-ilto  (NEMLUVIO ) 30 MG SQ injection Inject 60 mg into the skin every 28 (twenty-eight) days. 2 each 5   nystatin cream (MYCOSTATIN) APPLY TO AFFECTED AREAS 3 TIMES DAILY AS NEEDED     ondansetron  (ZOFRAN ) 4 MG tablet Take 1 tablet (4 mg total) by mouth daily as needed for nausea or vomiting. 20 tablet 1   pimecrolimus  (ELIDEL ) 1 % cream APPLY TOPICALLY TO AFFECTED AREAS AT LEGS ONCE A DAY 30 g 2   polyethylene glycol powder (GLYCOLAX /MIRALAX ) 17 GM/SCOOP powder Take 17 g by mouth 2 (two) times daily as needed. 3350 g 1   tacrolimus  (PROTOPIC ) 0.1 % ointment Apply topically 2 (two) times daily. 100 g 2   traMADol  (ULTRAM ) 50 MG tablet Take 1 tablet (50 mg total) by mouth every 6 (six) hours as needed. 12 tablet 0   docusate sodium  (COLACE) 100 MG capsule Take 1 capsule (100 mg total) by mouth 2 (two) times daily. (Patient not taking: Reported on 01/01/2024) 10 capsule 0   metFORMIN  (GLUCOPHAGE -XR) 500 MG 24 hr tablet TAKE 1 TABLET BY MOUTH 3 TIMES DAILY 90 tablet 1   tralokinumab  (ADBRY ) 150 MG/ML SOSY INJECT 2 SYRINGES UNDER THE SKIN EVERY 14 DAYS 4 mL 5   triamcinolone  cream (KENALOG ) 0.1 % APPLY TOPICALLY EVERY DAY. AVOID FACE, GROIN, UNDERARMS. 453.6 g 0   triamcinolone  cream (KENALOG ) 0.1 % Apply 1 Application topically 2 (two) times daily as needed. 454 g 2   Current Facility-Administered Medications on File Prior to Visit  Medication Dose Route Frequency Provider Last Rate Last Admin   nemolizumab-ilto  (NEMLUVIO ) SQ injection 30 mg  30 mg Subcutaneous Q28 days Hester Alm BROCKS, MD  30 mg at 12/27/23 1442    There are no Patient Instructions on file for this visit. No follow-ups on file.   Aroura Vasudevan E Etheridge Geil, NP

## 2024-01-24 ENCOUNTER — Ambulatory Visit (INDEPENDENT_AMBULATORY_CARE_PROVIDER_SITE_OTHER)

## 2024-01-24 DIAGNOSIS — L2081 Atopic neurodermatitis: Secondary | ICD-10-CM | POA: Diagnosis not present

## 2024-01-24 DIAGNOSIS — L281 Prurigo nodularis: Secondary | ICD-10-CM

## 2024-01-24 NOTE — Progress Notes (Signed)
 Patient here today for Nemluvio  injections for prurigo nodularis.    Nemluvio  30mg /mL injected SQ into the R upper arm, and Nemluvio  30mg /mL injected SQ into the L upper arm. Patient tolerated injections well.   NDC: 9700-3779-84 Lot: [id number] Exp: 03/2026  Rosina Mayans CMA, AAMA

## 2024-02-09 ENCOUNTER — Other Ambulatory Visit: Payer: Self-pay | Admitting: Podiatry

## 2024-02-28 ENCOUNTER — Ambulatory Visit (INDEPENDENT_AMBULATORY_CARE_PROVIDER_SITE_OTHER)

## 2024-02-28 DIAGNOSIS — L2081 Atopic neurodermatitis: Secondary | ICD-10-CM

## 2024-02-28 NOTE — Progress Notes (Signed)
 Patient here today for Nemluvio  injections for prurigo nodularis.    Nemluvio  30mg /mL injected SQ into the R upper arm, and Nemluvio  30mg /mL injected SQ into the L upper arm. Patient tolerated injections well.   NDC: 9700-3779-84 Lot: [id number] Exp: 03/23/2026   Alan Pizza, RMA

## 2024-03-04 ENCOUNTER — Ambulatory Visit: Admitting: Dermatology

## 2024-03-04 DIAGNOSIS — L2081 Atopic neurodermatitis: Secondary | ICD-10-CM | POA: Diagnosis not present

## 2024-03-04 DIAGNOSIS — T07XXXA Unspecified multiple injuries, initial encounter: Secondary | ICD-10-CM

## 2024-03-04 DIAGNOSIS — S80812A Abrasion, left lower leg, initial encounter: Secondary | ICD-10-CM

## 2024-03-04 DIAGNOSIS — L299 Pruritus, unspecified: Secondary | ICD-10-CM | POA: Diagnosis not present

## 2024-03-04 DIAGNOSIS — Z79899 Other long term (current) drug therapy: Secondary | ICD-10-CM | POA: Diagnosis not present

## 2024-03-04 DIAGNOSIS — Z7189 Other specified counseling: Secondary | ICD-10-CM

## 2024-03-04 DIAGNOSIS — S80811A Abrasion, right lower leg, initial encounter: Secondary | ICD-10-CM

## 2024-03-04 DIAGNOSIS — L281 Prurigo nodularis: Secondary | ICD-10-CM | POA: Diagnosis not present

## 2024-03-04 NOTE — Patient Instructions (Signed)

## 2024-03-04 NOTE — Progress Notes (Unsigned)
 "  Follow-Up Visit   Subjective  Jill Holmes is a 69 y.o. female who presents for the following:  6 month atopic dermatitis/prurigo nodularis  Reports she is doing well on Nemluvio  injections. No new flares.   The following portions of the chart were reviewed this encounter and updated as appropriate: medications, allergies, medical history  Review of Systems:  No other skin or systemic complaints except as noted in HPI or Assessment and Plan.  Objective  Well appearing patient in no apparent distress; mood and affect are within normal limits.   A focused examination was performed of the following areas: B/l legs, b/l arms   Relevant exam findings are noted in the Assessment and Plan.  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 Condition is improving with treatment but not currently at goal.   Atopic dermatitis (eczema) is a chronic, relapsing, pruritic condition that can significantly affect quality of life. It is often associated with allergic rhinitis and/or asthma and can require treatment with topical medications, phototherapy, or in severe cases biologic injectable medication (Dupixent ; Adbry ) or Oral JAK inhibitors.  Long term medication management.  Patient is using long term (months to years) prescription medication  to control their dermatologic condition.  These medications require periodic monitoring to evaluate for efficacy and side effects and may require periodic laboratory monitoring.   Treatment Plan:    Will continue Nemluvio  30  mg injections q 4 weeks    Continue Zoryve cream - apply thin layer topically to affected areas once daily   Continue Protopic  once daily either morning or night to affected areas    Continue Allegra 180mg  1 tab po BID, Benadryl OTC QHS PRN, and Mupirocin  ointment apply to affected scabs or sores on body qd-bid    Continue N-acetylcysteine (NAC) 600 mg supplement three times per day to help with picking.  Will recheck in 6 months   Recommend gentle skin care.     Reviewed risks of biologics including immunosuppression, infections, injection site reaction, and failure to improve condition. Goal is control of skin condition, not cure.  Some older biologics such as Humira and Enbrel may slightly increase risk of malignancy and may worsen congestive heart failure.  Taltz and Cosentyx may cause inflammatory bowel disease to flare. The use of biologics requires long term medication management, including periodic office visits and monitoring of blood work.  ATOPIC NEURODERMATITIS   Existing Treatments - mupirocin  ointment (BACTROBAN ) 2 % - APPLY TO ANY SCABS OR SORES 1-2 TIMES DAILY AS DIRECTED. - tacrolimus  (PROTOPIC ) 0.1 % ointment - Apply topically 2 (two) times daily. - nemolizumab-ilto  (NEMLUVIO ) SQ injection 30 mg - pimecrolimus  (ELIDEL ) 1 % cream - APPLY TOPICALLY TO AFFECTED AREAS AT LEGS ONCE A DAY PRURIGO NODULARIS   PRURITUS   MULTIPLE EXCORIATIONS   COUNSELING AND COORDINATION OF CARE   MEDICATION MANAGEMENT   LONG-TERM USE OF HIGH-RISK MEDICATION    Return for 4 week injection for nemluvio , 6 month atopic dermatitis .  I, Eleanor Blush,  CMA, am acting as scribe for Alm Rhyme, MD.   Documentation: I have reviewed the above documentation for accuracy and completeness, and I agree with the above.  Alm Rhyme, MD    "

## 2024-03-05 ENCOUNTER — Encounter: Payer: Self-pay | Admitting: Dermatology

## 2024-03-27 ENCOUNTER — Ambulatory Visit

## 2024-03-27 DIAGNOSIS — L281 Prurigo nodularis: Secondary | ICD-10-CM

## 2024-03-27 DIAGNOSIS — L2081 Atopic neurodermatitis: Secondary | ICD-10-CM

## 2024-03-27 MED ORDER — NEMOLIZUMAB-ILTO 30 MG ~~LOC~~ AUIJ
60.0000 mg | AUTO-INJECTOR | Freq: Once | SUBCUTANEOUS | Status: AC
  Administered 2024-03-27: 60 mg via SUBCUTANEOUS

## 2024-03-27 NOTE — Progress Notes (Signed)
 Patient here today for Nemluvio  injections for prurigo nodularis.    Nemluvio  30mg /mL injected SQ into the R upper arm, and Nemluvio  30mg /mL injected SQ into the L upper arm. Patient tolerated injections well.   NDC: 9700-3779-84 Lot: [id number] Exp: 03/23/2026   Alan Pizza, RMA

## 2024-04-02 ENCOUNTER — Ambulatory Visit (INDEPENDENT_AMBULATORY_CARE_PROVIDER_SITE_OTHER): Admitting: Nurse Practitioner

## 2024-04-24 ENCOUNTER — Ambulatory Visit

## 2024-09-02 ENCOUNTER — Ambulatory Visit: Admitting: Dermatology
